# Patient Record
Sex: Male | Born: 1950
Health system: Southern US, Community
[De-identification: ages and names within clinical notes are randomized; demographics above are authoritative.]

## PROBLEM LIST (undated history)

## (undated) DIAGNOSIS — C801 Malignant (primary) neoplasm, unspecified: Secondary | ICD-10-CM

## (undated) DIAGNOSIS — K219 Gastro-esophageal reflux disease without esophagitis: Secondary | ICD-10-CM

## (undated) DIAGNOSIS — T7840XA Allergy, unspecified, initial encounter: Secondary | ICD-10-CM

## (undated) HISTORY — DX: Allergy, unspecified, initial encounter: T78.40XA

## (undated) HISTORY — DX: Gastro-esophageal reflux disease without esophagitis: K21.9

## (undated) HISTORY — DX: Malignant (primary) neoplasm, unspecified: C80.1

## (undated) HISTORY — PX: OTHER SURGICAL HISTORY: SHX169

---

## 2017-09-02 ENCOUNTER — Ambulatory Visit (INDEPENDENT_AMBULATORY_CARE_PROVIDER_SITE_OTHER): Payer: Medicare Other | Admitting: Family Medicine

## 2017-09-02 ENCOUNTER — Encounter: Payer: Self-pay | Admitting: Family Medicine

## 2017-09-02 VITALS — BP 120/70 | HR 68 | Ht 66.6 in | Wt 168.5 lb

## 2017-09-02 DIAGNOSIS — N401 Enlarged prostate with lower urinary tract symptoms: Secondary | ICD-10-CM | POA: Insufficient documentation

## 2017-09-02 DIAGNOSIS — Z0000 Encounter for general adult medical examination without abnormal findings: Secondary | ICD-10-CM | POA: Insufficient documentation

## 2017-09-02 DIAGNOSIS — Z0001 Encounter for general adult medical examination with abnormal findings: Secondary | ICD-10-CM

## 2017-09-02 DIAGNOSIS — E7800 Pure hypercholesterolemia, unspecified: Secondary | ICD-10-CM

## 2017-09-02 DIAGNOSIS — R3912 Poor urinary stream: Secondary | ICD-10-CM

## 2017-09-02 MED ORDER — TAMSULOSIN HCL 0.4 MG PO CAPS: 0.4000 mg | ORAL_CAPSULE | Freq: Every day | ORAL | 3 refills | Status: DC

## 2017-09-02 NOTE — Patient Instructions (Signed)
Health Maintenance, Male A healthy lifestyle and preventive care is important for your health and wellness. Ask your health care provider about what schedule of regular examinations is right for you. What should I know about weight and diet? Eat a Healthy Diet  Eat plenty of vegetables, fruits, whole grains, low-fat dairy products, and lean protein.  Do not eat a lot of foods high in solid fats, added sugars, or salt.  Maintain a Healthy Weight Regular exercise can help you achieve or maintain a healthy weight. You should:  Do at least 150 minutes of exercise each week. The exercise should increase your heart rate and make you sweat (moderate-intensity exercise).  Do strength-training exercises at least twice a week.  Watch Your Levels of Cholesterol and Blood Lipids  Have your blood tested for lipids and cholesterol every 5 years starting at 66 years of age. If you are at high risk for heart disease, you should start having your blood tested when you are 66 years old. You may need to have your cholesterol levels checked more often if: ? Your lipid or cholesterol levels are high. ? You are older than 66 years of age. ? You are at high risk for heart disease.  What should I know about cancer screening? Many types of cancers can be detected early and may often be prevented. Lung Cancer  You should be screened every year for lung cancer if: ? You are a current smoker who has smoked for at least 30 years. ? You are a former smoker who has quit within the past 15 years.  Talk to your health care provider about your screening options, when you should start screening, and how often you should be screened.  Colorectal Cancer  Routine colorectal cancer screening usually begins at 66 years of age and should be repeated every 5-10 years until you are 66 years old. You may need to be screened more often if early forms of precancerous polyps or small growths are found. Your health care provider  may recommend screening at an earlier age if you have risk factors for colon cancer.  Your health care provider may recommend using home test kits to check for hidden blood in the stool.  A small camera at the end of a tube can be used to examine your colon (sigmoidoscopy or colonoscopy). This checks for the earliest forms of colorectal cancer.  Prostate and Testicular Cancer  Depending on your age and overall health, your health care provider may do certain tests to screen for prostate and testicular cancer.  Talk to your health care provider about any symptoms or concerns you have about testicular or prostate cancer.  Skin Cancer  Check your skin from head to toe regularly.  Tell your health care provider about any new moles or changes in moles, especially if: ? There is a change in a mole's size, shape, or color. ? You have a mole that is larger than a pencil eraser.  Always use sunscreen. Apply sunscreen liberally and repeat throughout the day.  Protect yourself by wearing long sleeves, pants, a wide-brimmed hat, and sunglasses when outside.  What should I know about heart disease, diabetes, and high blood pressure?  If you are 18-39 years of age, have your blood pressure checked every 3-5 years. If you are 40 years of age or older, have your blood pressure checked every year. You should have your blood pressure measured twice-once when you are at a hospital or clinic, and once when   you are not at a hospital or clinic. Record the average of the two measurements. To check your blood pressure when you are not at a hospital or clinic, you can use: ? An automated blood pressure machine at a pharmacy. ? A home blood pressure monitor.  Talk to your health care provider about your target blood pressure.  If you are between 11-39 years old, ask your health care provider if you should take aspirin to prevent heart disease.  Have regular diabetes screenings by checking your fasting blood  sugar level. ? If you are at a normal weight and have a low risk for diabetes, have this test once every three years after the age of 74. ? If you are overweight and have a high risk for diabetes, consider being tested at a younger age or more often.  A one-time screening for abdominal aortic aneurysm (AAA) by ultrasound is recommended for men aged 7-75 years who are current or former smokers. What should I know about preventing infection? Hepatitis B If you have a higher risk for hepatitis B, you should be screened for this virus. Talk with your health care provider to find out if you are at risk for hepatitis B infection. Hepatitis C Blood testing is recommended for:  Everyone born from [birth year] through 1964.  Anyone with known risk factors for hepatitis C.  Sexually Transmitted Diseases (STDs)  You should be screened each year for STDs including gonorrhea and chlamydia if: ? You are sexually active and are younger than 66 years of age. ? You are older than 66 years of age and your health care provider tells you that you are at risk for this type of infection. ? Your sexual activity has changed since you were last screened and you are at an increased risk for chlamydia or gonorrhea. Ask your health care provider if you are at risk.  Talk with your health care provider about whether you are at high risk of being infected with HIV. Your health care provider may recommend a prescription medicine to help prevent HIV infection.  What else can I do?  Schedule regular health, dental, and eye exams.  Stay current with your vaccines (immunizations).  Do not use any tobacco products, such as cigarettes, chewing tobacco, and e-cigarettes. If you need help quitting, ask your health care provider.  Limit alcohol intake to no more than 2 drinks per day. One drink equals 12 ounces of beer, 5 ounces of wine, or 1 ounces of hard liquor.  Do not use street drugs.  Do not share needles.  Ask your  health care provider for help if you need support or information about quitting drugs.  Tell your health care provider if you often feel depressed.  Tell your health care provider if you have ever been abused or do not feel safe at home. This information is not intended to replace advice given to you by your health care provider. Make sure you discuss any questions you have with your health care provider. Document Released: 07/12/2007 Document Revised: 09/12/2015 Document Reviewed: 10/17/2014 Elsevier Interactive Patient Education  2018 [employer]  Benign Prostatic Hyperplasia Benign prostatic hyperplasia (BPH) is an enlarged prostate gland that is caused by the normal aging process and not by cancer. The prostate is a walnut-sized gland that is involved in the production of semen. It is located in front of the rectum and below the bladder. The bladder stores urine and the urethra is the tube that carries the urine  out of the body. The prostate may get bigger as a man gets older. An enlarged prostate can press on the urethra. This can make it harder to pass urine. The build-up of urine in the bladder can cause infection. Back pressure and infection may progress to bladder damage and kidney (renal) failure. What are the causes? This condition is part of a normal aging process. However, not all men develop problems from this condition. If the prostate enlarges away from the urethra, urine flow will not be blocked. If it enlarges toward the urethra and compresses it, there will be problems passing urine. What increases the risk? This condition is more likely to develop in men over the age of 80 years. What are the signs or symptoms? Symptoms of this condition include:  Getting up often during the night to urinate.  Needing to urinate frequently during the day.  Difficulty starting urine flow.  Decrease in size and strength of your urine stream.  Leaking (dribbling) after  urinating.  Inability to pass urine. This needs immediate treatment.  Inability to completely empty your bladder.  Pain when you pass urine. This is more common if there is also an infection.  Urinary tract infection (UTI).  How is this diagnosed? This condition is diagnosed based on your medical history, a physical exam, and your symptoms. Tests will also be done, such as:  A post-void bladder scan. This measures any amount of urine that may remain in your bladder after you finish urinating.  A digital rectal exam. In a rectal exam, your health care provider checks your prostate by putting a lubricated, gloved finger into your rectum to feel the back of your prostate gland. This exam detects the size of your gland and any abnormal lumps or growths.  An exam of your urine (urinalysis).  A prostate specific antigen (PSA) screening. This is a blood test used to screen for prostate cancer.  An ultrasound. This test uses sound waves to electronically produce a picture of your prostate gland.  Your health care provider may refer you to a specialist in kidney and prostate diseases (urologist). How is this treated? Once symptoms begin, your health care provider will monitor your condition (active surveillance or watchful waiting). Treatment for this condition will depend on the severity of your condition. Treatment may include:  Observation and yearly exams. This may be the only treatment needed if your condition and symptoms are mild.  Medicines to relieve your symptoms, including: ? Medicines to shrink the prostate. ? Medicines to relax the muscle of the prostate.  Surgery in severe cases. Surgery may include: ? Prostatectomy. In this procedure, the prostate tissue is removed completely through an open incision or with a laparascope or robotics. ? Transurethral resection of the prostate (TURP). In this procedure, a tool is inserted through the opening at the tip of the penis (urethra).  It is used to cut away tissue of the inner core of the prostate. The pieces are removed through the same opening of the penis. This removes the blockage. ? Transurethral incision (TUIP). In this procedure, small cuts are made in the prostate. This lessens the prostate's pressure on the urethra. ? Transurethral microwave thermotherapy (TUMT). This procedure uses microwaves to create heat. The heat destroys and removes a small amount of prostate tissue. ? Transurethral needle ablation (TUNA). This procedure uses radio frequencies to destroy and remove a small amount of prostate tissue. ? Interstitial laser coagulation ([place]). This procedure uses a laser to destroy and  remove a small amount of prostate tissue. ? Transurethral electrovaporization (TUVP). This procedure uses electrodes to destroy and remove a small amount of prostate tissue. ? Prostatic urethral lift. This procedure inserts an implant to push the lobes of the prostate away from the urethra.  Follow these instructions at home:  Take over-the-counter and prescription medicines only as told by your health care provider.  Monitor your symptoms for any changes. Contact your health care provider with any changes.  Avoid drinking large amounts of liquid before going to bed or out in public.  Avoid or reduce how much caffeine or alcohol you drink.  Give yourself time when you urinate.  Keep all follow-up visits as told by your health care provider. This is important. Contact a health care provider if:  You have unexplained back pain.  Your symptoms do not get better with treatment.  You develop side effects from the medicine you are taking.  Your urine becomes very dark or has a bad smell.  Your lower abdomen becomes distended and you have trouble passing your urine. Get help right away if:  You have a fever or chills.  You suddenly cannot urinate.  You feel lightheaded, or very dizzy, or you faint.  There are large amounts  of blood or clots in the urine.  Your urinary problems become hard to manage.  You develop moderate to severe low back or flank pain. The flank is the side of your body between the ribs and the hip. These symptoms may represent a serious problem that is an emergency. Do not wait to see if the symptoms will go away. Get medical help right away. Call your local emergency services (911 in the [place] Do not drive yourself to the hospital. Summary  Benign prostatic hyperplasia (BPH) is an enlarged prostate that is caused by the normal aging process and not by cancer.  An enlarged prostate can press on the urethra. This can make it hard to pass urine.  This condition is part of a normal aging process and is more likely to develop in men over the age of 55 years.  Get help right away if you suddenly cannot urinate. This information is not intended to replace advice given to you by your health care provider. Make sure you discuss any questions you have with your health care provider. Document Released: 01/13/2005 Document Revised: 02/18/2016 Document Reviewed: 02/18/2016 Elsevier Interactive Patient Education  [employer].

## 2017-09-02 NOTE — Progress Notes (Addendum)
Subjective:  Patient ID: Robert Meyers, male    DOB: 11-04-1950  Age: 67 y.o. MRN: [id number]  CC: Establish Care   HPI Glyndon Tursi presents for a physical exam and establishment of care.  He enjoys excellent health.  He lives with his wife for 47 years.  They have 2 grown children and several grandchildren.  He works as an Engineer, structural in [place].  He does not smoke or use illicit drugs.  He rarely drinks alcohol.  He has no regular exercise program but he is quite active at work.  Last colonoscopy was 3 years ago.  He is status post excision of malignant melanoma on his back.  He does have issues with bilateral foot pain at the end of the day.  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  He does have some issues with decreased urine flow and occasional nocturia.  There is no blood in his urine.  He is not interested in surgery but might be interested in trying medical therapy for this.  History Juanita has a past medical history of Allergy, Cancer ([place]), and GERD (gastroesophageal reflux disease).   He has a past surgical history that includes deep axillary lymphnode biopsy; melanoma wide excision surgery right upper back (stage 2); excision of spermatocele with epididyectomy; bilateral inguinal hernia repair; and excision left radial head.   His family history includes Cancer in his father; Hyperlipidemia in his mother.He reports that he has never smoked. He has never used smokeless tobacco. His alcohol and drug histories are not on file.  Outpatient Medications Prior to Visit  Medication Sig Dispense Refill  . aspirin EC 81 MG tablet Take 81 mg by mouth daily.    Marland Kitchen omeprazole (PRILOSEC) 20 MG capsule Take 20 mg by mouth daily.     No facility-administered medications prior to visit.     ROS Review of Systems  Constitutional: Negative for chills, fatigue, fever and unexpected weight change.  HENT: Negative.   Eyes: Negative for photophobia and  visual disturbance.  Respiratory: Negative.   Cardiovascular: Negative.   Gastrointestinal: Negative.   Endocrine: Negative for polyphagia and polyuria.  Genitourinary: Positive for difficulty urinating. Negative for decreased urine volume, dysuria and frequency.  Musculoskeletal: Positive for arthralgias. Negative for myalgias.  Skin: Negative for color change and pallor.  Allergic/Immunologic: Negative for immunocompromised state.  Neurological: Negative for seizures, speech difficulty and light-headedness.  Hematological: Does not bruise/bleed easily.  Psychiatric/Behavioral: Negative.     Objective:  BP 120/70   Pulse 68   Ht 5' 6.6" (1.692 m)   Wt 168 lb 8 oz (76.4 kg)   SpO2 98%   BMI 26.71 kg/m   Physical Exam  Constitutional: He is oriented to person, place, and time. He appears well-developed and well-nourished. No distress.  HENT:  Head: Normocephalic and atraumatic.  Right Ear: External ear normal.  Left Ear: External ear normal.  Mouth/Throat: Oropharynx is clear and moist. No oropharyngeal exudate.  Eyes: Pupils are equal, round, and reactive to light. Conjunctivae and EOM are normal. Right eye exhibits no discharge. Left eye exhibits no discharge. No scleral icterus.  Neck: Normal range of motion. Neck supple. No JVD present. No tracheal deviation present. No thyromegaly present.  Cardiovascular: Normal rate, regular rhythm and normal heart sounds.  Pulses:      Dorsalis pedis pulses are 2+ on the right side, and 2+ on the left side.       Posterior tibial  pulses are 2+ on the right side, and 2+ on the left side.  Pulmonary/Chest: Effort normal and breath sounds normal.  Abdominal: Soft. Bowel sounds are normal. He exhibits no distension and no mass. There is no tenderness. There is no rebound and no guarding.  Genitourinary: Rectal exam shows no external hemorrhoid, no internal hemorrhoid, no fissure, no mass, no tenderness, anal tone normal and guaiac negative  stool. Prostate is not enlarged and not tender.  Musculoskeletal: He exhibits no edema or tenderness.       Right foot: There is deformity (planus).       Left foot: There is deformity (planus).  Lymphadenopathy:    He has no cervical adenopathy.  Neurological: He is alert and oriented to person, place, and time.  Skin: Skin is warm and dry. Capillary refill takes less than 2 seconds. No rash noted. He is not diaphoretic. No erythema. No pallor.  Psychiatric: He has a normal mood and affect. His behavior is normal. Thought content normal.    The 10-year ASCVD risk score Mikey Bussing DC Jr., et al., 2013) is: 13.2%   Values used to calculate the score:     Age: 49 years     Sex: Male     Is Non-Hispanic African American: No     Diabetic: No     Tobacco smoker: No     Systolic Blood Pressure: 973 mmHg     Is BP treated: No     HDL Cholesterol: 43.3 mg/dL     Total Cholesterol: 219 mg/dL  Assessment & Plan:   Zade was seen today for establish care.  Diagnoses and all orders for this visit:  Healthcare maintenance -     CBC; Future -     Comprehensive metabolic panel; Future -     Lipid panel; Future -     Urinalysis, Routine w reflex microscopic; Future -     HIV antibody; Future  Benign prostatic hyperplasia with weak urinary stream -     tamsulosin (FLOMAX) 0.4 MG CAPS capsule; Take 1 capsule (0.4 mg total) by mouth daily. -     PSA; Future -     Urinalysis, Routine w reflex microscopic; Future  Elevated LDL cholesterol level -     atorvastatin (LIPITOR) 20 MG tablet; Take 1 tablet (20 mg total) by mouth daily.   I am having Massie Bougie start on tamsulosin and atorvastatin. I am also having him maintain his aspirin EC and omeprazole.  Meds ordered this encounter  Medications  . tamsulosin (FLOMAX) 0.4 MG CAPS capsule    Sig: Take 1 capsule (0.4 mg total) by mouth daily.    Dispense:  30 capsule    Refill:  3  . atorvastatin (LIPITOR) 20 MG tablet    Sig: Take 1  tablet (20 mg total) by mouth daily.    Dispense:  90 tablet    Refill:  1   Patient will return fasting for his blood work.  Overall his health is quite good.  Advised him to change his shoes on a regular basis.  He will consider referral to sports medicine for orthotics.  He will try Flomax and RTC in 3 months for follow-up.  Anticipatory guidance was given regarding health maintenance and prevention of disease.  Addendum: Pt with sig risk of ascvd in 10 years. Sp 10 minute discussion with him he agree for a trial of atorvastatin.   Follow-up: Return in about 3 months (around 12/03/2017).  [name],  MD

## 2017-09-03 ENCOUNTER — Other Ambulatory Visit (INDEPENDENT_AMBULATORY_CARE_PROVIDER_SITE_OTHER): Payer: Medicare Other

## 2017-09-03 ENCOUNTER — Encounter: Payer: Self-pay | Admitting: Family Medicine

## 2017-09-03 DIAGNOSIS — Z0000 Encounter for general adult medical examination without abnormal findings: Secondary | ICD-10-CM

## 2017-09-03 DIAGNOSIS — N401 Enlarged prostate with lower urinary tract symptoms: Secondary | ICD-10-CM

## 2017-09-03 DIAGNOSIS — R3912 Poor urinary stream: Secondary | ICD-10-CM | POA: Diagnosis not present

## 2017-09-03 LAB — URINALYSIS, ROUTINE W REFLEX MICROSCOPIC
BILIRUBIN URINE: NEGATIVE
Hgb urine dipstick: NEGATIVE
KETONES UR: NEGATIVE
LEUKOCYTES UA: NEGATIVE
Nitrite: NEGATIVE
PH: 7 (ref 5.0–8.0)
RBC / HPF: NONE SEEN (ref 0–?)
Specific Gravity, Urine: 1.015 (ref 1.000–1.030)
Total Protein, Urine: NEGATIVE
UROBILINOGEN UA: 0.2 (ref 0.0–1.0)
Urine Glucose: NEGATIVE

## 2017-09-03 LAB — COMPREHENSIVE METABOLIC PANEL
ALK PHOS: 43 U/L (ref 39–117)
ALT: 27 U/L (ref 0–53)
AST: 21 U/L (ref 0–37)
Albumin: 4.5 g/dL (ref 3.5–5.2)
BILIRUBIN TOTAL: 0.7 mg/dL (ref 0.2–1.2)
BUN: 21 mg/dL (ref 6–23)
CO2: 32 mEq/L (ref 19–32)
CREATININE: 1.05 mg/dL (ref 0.40–1.50)
Calcium: 9.4 mg/dL (ref 8.4–10.5)
Chloride: 102 mEq/L (ref 96–112)
GFR: 75.16 mL/min (ref >60.00)
GLUCOSE: 100 mg/dL — AB (ref 70–99)
Potassium: 4.1 mEq/L (ref 3.5–5.1)
SODIUM: 140 meq/L (ref 135–145)
TOTAL PROTEIN: 6.5 g/dL (ref 6.0–8.3)

## 2017-09-03 LAB — LIPID PANEL
CHOLESTEROL: 219 mg/dL — AB (ref 0–200)
HDL: 43.3 mg/dL (ref >39.00)
LDL Cholesterol: 153 mg/dL — ABNORMAL HIGH (ref 0–99)
NONHDL: 175.9
Total CHOL/HDL Ratio: 5
Triglycerides: 114 mg/dL (ref 0.0–149.0)
VLDL: 22.8 mg/dL (ref 0.0–40.0)

## 2017-09-03 LAB — CBC
HCT: 44.2 % (ref 39.0–52.0)
HEMOGLOBIN: 15.1 g/dL (ref 13.0–17.0)
MCHC: 34.1 g/dL (ref 30.0–36.0)
MCV: 92.4 fl (ref 78.0–100.0)
Platelets: 218 10*3/uL (ref 150.0–400.0)
RBC: 4.78 Mil/uL (ref 4.22–5.81)
RDW: 12.5 % (ref 11.5–15.5)
WBC: 6.8 10*3/uL (ref 4.0–10.5)

## 2017-09-03 LAB — PSA: PSA: 1.26 ng/mL (ref 0.10–4.00)

## 2017-09-04 LAB — HIV ANTIBODY (ROUTINE TESTING W REFLEX): HIV 1&2 Ab, 4th Generation: NONREACTIVE

## 2017-09-07 ENCOUNTER — Telehealth: Payer: Self-pay

## 2017-09-07 NOTE — Telephone Encounter (Signed)
Copied from [place] [id number]. Topic: General - Call Back - No Documentation >> Sep 07, 2017  2:04 PM Conception Chancy, NT wrote: Reason for CRM: patient is calling to return Dr. Ethelene Hal phone call he states he missed Friday 09/04/17.

## 2017-09-08 MED ORDER — ATORVASTATIN CALCIUM 20 MG PO TABS
20.0000 mg | ORAL_TABLET | Freq: Every day | ORAL | 1 refills | Status: DC
Start: 2017-09-08 — End: 2018-10-18

## 2017-09-08 NOTE — Addendum Note (Signed)
Addended by: Jon Billings on: 09/08/2017 08:56 AM   Modules accepted: Orders

## 2017-09-09 NOTE — Telephone Encounter (Signed)
Provider spoke with patient, see result note.

## 2018-10-18 ENCOUNTER — Ambulatory Visit (INDEPENDENT_AMBULATORY_CARE_PROVIDER_SITE_OTHER): Payer: Medicare Other | Admitting: Family Medicine

## 2018-10-18 ENCOUNTER — Other Ambulatory Visit: Payer: Self-pay

## 2018-10-18 ENCOUNTER — Telehealth: Payer: Self-pay

## 2018-10-18 ENCOUNTER — Encounter: Payer: Self-pay | Admitting: Family Medicine

## 2018-10-18 DIAGNOSIS — J0191 Acute recurrent sinusitis, unspecified: Secondary | ICD-10-CM | POA: Insufficient documentation

## 2018-10-18 DIAGNOSIS — J0100 Acute maxillary sinusitis, unspecified: Secondary | ICD-10-CM

## 2018-10-18 DIAGNOSIS — E7800 Pure hypercholesterolemia, unspecified: Secondary | ICD-10-CM | POA: Insufficient documentation

## 2018-10-18 MED ORDER — ATORVASTATIN CALCIUM 20 MG PO TABS: 20.0000 mg | ORAL_TABLET | Freq: Every day | ORAL | 1 refills | Status: DC

## 2018-10-18 MED ORDER — PREDNISONE 10 MG PO TABS: 10.0000 mg | ORAL_TABLET | Freq: Two times a day (BID) | ORAL | 0 refills | Status: AC

## 2018-10-18 MED ORDER — AMOXICILLIN 500 MG PO CAPS
500.0000 mg | ORAL_CAPSULE | Freq: Three times a day (TID) | ORAL | 0 refills | Status: DC
Start: 2018-10-18 — End: 2018-12-10

## 2018-10-18 NOTE — Telephone Encounter (Signed)
Copied from [place] [id number]. Topic: General - Inquiry >> Oct 18, 2018 11:28 AM Richardo Priest, NT wrote: Reason for CRM: Patient called in stating he is having a sinus infection for 2 months.Patient is just wanting an antibiotic to be called in. Offered appointment but stated he does not want one. Please advise. Call back is [phone].

## 2018-10-18 NOTE — Telephone Encounter (Signed)
I called and spoke to patient. Patient schedule for a virtual visit today 10/18/2018 with Dr. Ethelene Hal.

## 2018-10-18 NOTE — Telephone Encounter (Signed)
Can we offer him a virtual visit? He hasn't been seen in over a year.

## 2018-10-18 NOTE — Progress Notes (Signed)
Established Patient Office Visit  Subjective:  Patient ID: Robert Meyers, male    DOB: [birth year]  Age: 68 y.o. MRN: [id number]  CC:  Chief Complaint  Patient presents with  . Sinusitis    x 2 months    HPI Robert Meyers presents for treatment and evaluation of a 2 to 102-month history of ongoing nasal congestion postnasal drip sneezing.  Is become worse over the last 2 to 3 weeks with facial pressure in his left cheekbone associated with upper tooth discomfort on the left side.  He has had no fevers or chills cough or difficulty breathing.  No change in his taste smell.  He never did start the tamsulosin.  He does not feel as though his urine flow is compromised to the point to where he needs medication at this time.  He did buy the atorvastatin but never started it.  Past Medical History:  Diagnosis Date  . Allergy   . Cancer ([place])   . GERD (gastroesophageal reflux disease)     Past Surgical History:  Procedure Laterality Date  . bilateral inguinal hernia repair    . deep axillary lymphnode biopsy    . excision left radial head    . excision of spermatocele with epididyectomy    . melanoma wide excision surgery right upper back (stage 2)      Family History  Problem Relation Age of Onset  . Hyperlipidemia Mother   . Cancer Father     Social History   Socioeconomic History  . Marital status: Married    Spouse name: Not on file  . Number of children: Not on file  . Years of education: Not on file  . Highest education level: Not on file  Occupational History  . Not on file  Social Needs  . Financial resource strain: Not on file  . Food insecurity    Worry: Not on file    Inability: Not on file  . Transportation needs    Medical: Not on file    Non-medical: Not on file  Tobacco Use  . Smoking status: Never Smoker  . Smokeless tobacco: Never Used  Substance and Sexual Activity  . Alcohol use: Not on file  . Drug use: Not on file  . Sexual activity: Not on  file  Lifestyle  . Physical activity    Days per week: Not on file    Minutes per session: Not on file  . Stress: Not on file  Relationships  . Social Herbalist on phone: Not on file    Gets together: Not on file    Attends religious service: Not on file    Active member of club or organization: Not on file    Attends meetings of clubs or organizations: Not on file    Relationship status: Not on file  . Intimate partner violence    Fear of current or ex partner: Not on file    Emotionally abused: Not on file    Physically abused: Not on file    Forced sexual activity: Not on file  Other Topics Concern  . Not on file  Social History Narrative  . Not on file    Outpatient Medications Prior to Visit  Medication Sig Dispense Refill  . aspirin EC 81 MG tablet Take 81 mg by mouth daily.    Marland Kitchen omeprazole (PRILOSEC) 20 MG capsule Take 20 mg by mouth daily.    . tamsulosin (FLOMAX) 0.4 MG CAPS capsule  Take 1 capsule (0.4 mg total) by mouth daily. 30 capsule 3  . atorvastatin (LIPITOR) 20 MG tablet Take 1 tablet (20 mg total) by mouth daily. 90 tablet 1   No facility-administered medications prior to visit.     Allergies  Allergen Reactions  . Levofloxacin Hives and Rash    Hives Hives Hives     ROS Review of Systems  Constitutional: Negative for chills, diaphoresis, fatigue, fever and unexpected weight change.  HENT: Positive for congestion, dental problem, postnasal drip, sinus pressure and sinus pain.   Eyes: Negative for photophobia and visual disturbance.  Respiratory: Negative for cough and shortness of breath.   Cardiovascular: Negative.   Gastrointestinal: Negative.   Endocrine: Negative for polyphagia and polyuria.  Musculoskeletal: Negative for arthralgias and joint swelling.  Skin: Negative for pallor and rash.  Neurological: Negative for headaches.  Hematological: Negative.   Psychiatric/Behavioral: Negative.       Objective:    Physical Exam   Constitutional: He is oriented to person, place, and time. He appears well-developed and well-nourished. No distress.  HENT:  Head: Normocephalic and atraumatic.  Right Ear: External ear normal.  Left Ear: External ear normal.  Eyes: Conjunctivae are normal. Right eye exhibits no discharge. Left eye exhibits no discharge. No scleral icterus.  Neck: No JVD present. No tracheal deviation present.  Pulmonary/Chest: Effort normal. No stridor.  Musculoskeletal: Normal range of motion.  Neurological: He is alert and oriented to person, place, and time.  Skin: Skin is warm and dry. He is not diaphoretic.  Psychiatric: He has a normal mood and affect. His behavior is normal.    There were no vitals taken for this visit. Wt Readings from Last 3 Encounters:  09/02/17 168 lb 8 oz (76.4 kg)   BP Readings from Last 3 Encounters:  09/02/17 120/70   Guideline developer:  UpToDate (see UpToDate for funding source) Date Released: June 2014  Health Maintenance Due  Topic Date Due  . Hepatitis C Screening  August 17, 1950  . TETANUS/TDAP  11/23/1970  . COLONOSCOPY  11/22/2001  . PNA vac Low Risk Adult (1 of 2 - PCV13) 11/22/2016  . INFLUENZA VACCINE  08/28/2018    There are no preventive care reminders to display for this patient.  No results found for: TSH Lab Results  Component Value Date   WBC 6.8 09/03/2017   HGB 15.1 09/03/2017   HCT 44.2 09/03/2017   MCV 92.4 09/03/2017   PLT 218.0 09/03/2017   Lab Results  Component Value Date   NA 140 09/03/2017   K 4.1 09/03/2017   CO2 32 09/03/2017   GLUCOSE 100 (H) 09/03/2017   BUN 21 09/03/2017   CREATININE 1.05 09/03/2017   BILITOT 0.7 09/03/2017   ALKPHOS 43 09/03/2017   AST 21 09/03/2017   ALT 27 09/03/2017   PROT 6.5 09/03/2017   ALBUMIN 4.5 09/03/2017   CALCIUM 9.4 09/03/2017   GFR 75.16 09/03/2017   Lab Results  Component Value Date   CHOL 219 (H) 09/03/2017   Lab Results  Component Value Date   HDL 43.30 09/03/2017    Lab Results  Component Value Date   LDLCALC 153 (H) 09/03/2017   Lab Results  Component Value Date   TRIG 114.0 09/03/2017   Lab Results  Component Value Date   CHOLHDL 5 09/03/2017   No results found for: HGBA1C    Assessment & Plan:   Problem List Items Addressed This Visit      Respiratory  Acute non-recurrent maxillary sinusitis - Primary   Relevant Medications   amoxicillin (AMOXIL) 500 MG capsule   predniSONE (DELTASONE) 10 MG tablet     Other   Elevated LDL cholesterol level   Relevant Medications   atorvastatin (LIPITOR) 20 MG tablet      Meds ordered this encounter  Medications  . amoxicillin (AMOXIL) 500 MG capsule    Sig: Take 1 capsule (500 mg total) by mouth 3 (three) times daily.    Dispense:  30 capsule    Refill:  0  . predniSONE (DELTASONE) 10 MG tablet    Sig: Take 1 tablet (10 mg total) by mouth 2 (two) times daily with a meal for 5 days.    Dispense:  10 tablet    Refill:  0  . atorvastatin (LIPITOR) 20 MG tablet    Sig: Take 1 tablet (20 mg total) by mouth daily.    Dispense:  90 tablet    Refill:  1    Follow-up: No follow-ups on file.   Patient will continue his nasal saline and Flonase.  Start amoxicillin and a short course of prednisone.  Follow-up if not improving in a week or 2.  Patient agrees to start the atorvastatin and follow-up prior to finishing it.  He will hold off with the tamsulosin at this point. Virtual Visit via Video Note  I connected with Robert Meyers on 10/18/18 at  1:30 PM EDT by a video enabled telemedicine application and verified that I am speaking with the correct person using two identifiers.  Location: Patient: home Provider:    I discussed the limitations of evaluation and management by telemedicine and the availability of in person appointments. The patient expressed understanding and agreed to proceed.  History of Present Illness:    Observations/Objective:   Assessment and Plan:   Follow Up  Instructions:    I discussed the assessment and treatment plan with the patient. The patient was provided an opportunity to ask questions and all were answered. The patient agreed with the plan and demonstrated an understanding of the instructions.   The patient was advised to call back or seek an in-person evaluation if the symptoms worsen or if the condition fails to improve as anticipated.  I provided 20 minutes of non-face-to-face time during this encounter.   [name], MD

## 2018-12-10 ENCOUNTER — Encounter: Payer: Self-pay | Admitting: Family Medicine

## 2018-12-10 ENCOUNTER — Ambulatory Visit (INDEPENDENT_AMBULATORY_CARE_PROVIDER_SITE_OTHER): Payer: Medicare Other | Admitting: Family Medicine

## 2018-12-10 ENCOUNTER — Other Ambulatory Visit: Payer: Self-pay

## 2018-12-10 DIAGNOSIS — J0191 Acute recurrent sinusitis, unspecified: Secondary | ICD-10-CM

## 2018-12-10 MED ORDER — DOXYCYCLINE HYCLATE 100 MG PO TABS: 100.0000 mg | ORAL_TABLET | Freq: Two times a day (BID) | ORAL | 0 refills | Status: DC

## 2018-12-10 NOTE — Assessment & Plan Note (Addendum)
-  Treat with course of doxycyline.  Allergies may be contributing as well.  Recommend OTC antihistamine such as allegra or cetirizine.  Recommend consistent use of flonase.  -If not improving with this we discussed possible referral to ENT.   -He will let us know if he has any new or worsening symptoms related to this.

## 2018-12-10 NOTE — Progress Notes (Signed)
Robert Meyers - 68 y.o. male MRN [id number]  Date of birth: 1951/01/22   This visit type was conducted due to national recommendations for restrictions regarding the COVID-19 Pandemic (e.g. social distancing).  This format is felt to be most appropriate for this patient at this time.  All issues noted in this document were discussed and addressed.  No physical exam was performed (except for noted visual exam findings with Video Visits).  I discussed the limitations of evaluation and management by telemedicine and the availability of in person appointments. The patient expressed understanding and agreed to proceed.  I connected with@ on 12/10/18 at 11:00 AM EST by a video enabled telemedicine application and verified that I am speaking with the correct person using two identifiers.  Present for visit: Robert Meyers   Patient Location: Home [place] [address]   Provider location:   [place]  Chief Complaint  Patient presents with  . Sinusitis    pt said he has headaches and congestion worse at night and he rinse his nose in the mornings mucus always comes out. Started in sept and antibiotics didnt work.    HPI  Robert Meyers is a 68 y.o. male who presents via audio/video conferencing for a telehealth visit today.  He has complaint of possible sinus infection.  He reports history of congestion and sinus headaches for several months with worsening here recently.  He is using sinus rinse daily which will help for a short period before he develops increased sinus pain and headaches again.  He was treated with amoxicillin and prednisone in September but doesn't really feel like things improved with this.  He does report a history of allergies.  He was prescribed flonase previously but never took this consistently. He has not tried an antihistamine.  He denies fever, chills, nausea, vomiting, diarrhea, body aches or cough with current symptoms.      ROS:  A comprehensive ROS was completed and negative except as noted per HPI  Past Medical History:  Diagnosis Date  . Allergy   . Cancer ([place])   . GERD (gastroesophageal reflux disease)     Past Surgical History:  Procedure Laterality Date  . bilateral inguinal hernia repair    . deep axillary lymphnode biopsy    . excision left radial head    . excision of spermatocele with epididyectomy    . melanoma wide excision surgery right upper back (stage 2)      Family History  Problem Relation Age of Onset  . Hyperlipidemia Mother   . Cancer Father     Social History   Socioeconomic History  . Marital status: Married    Spouse name: Not on file  . Number of children: Not on file  . Years of education: Not on file  . Highest education level: Not on file  Occupational History  . Not on file  Social Needs  . Financial resource strain: Not on file  . Food insecurity    Worry: Not on file    Inability: Not on file  . Transportation needs    Medical: Not on file    Non-medical: Not on file  Tobacco Use  . Smoking status: Never Smoker  . Smokeless tobacco: Never Used  Substance and Sexual Activity  . Alcohol use: Not on file  . Drug use: Not on file  . Sexual activity: Not on file  Lifestyle  . Physical activity    Days per week:  Not on file    Minutes per session: Not on file  . Stress: Not on file  Relationships  . Social Herbalist on phone: Not on file    Gets together: Not on file    Attends religious service: Not on file    Active member of club or organization: Not on file    Attends meetings of clubs or organizations: Not on file    Relationship status: Not on file  . Intimate partner violence    Fear of current or ex partner: Not on file    Emotionally abused: Not on file    Physically abused: Not on file    Forced sexual activity: Not on file  Other Topics Concern  . Not on file  Social History Narrative  . Not on file     Current  Outpatient Medications:  .  aspirin EC 81 MG tablet, Take 81 mg by mouth daily., Disp: , Rfl:  .  atorvastatin (LIPITOR) 20 MG tablet, Take 1 tablet (20 mg total) by mouth daily., Disp: 90 tablet, Rfl: 1 .  omeprazole (PRILOSEC) 20 MG capsule, Take 20 mg by mouth daily., Disp: , Rfl:  .  doxycycline (VIBRA-TABS) 100 MG tablet, Take 1 tablet (100 mg total) by mouth 2 (two) times daily., Disp: 20 tablet, Rfl: 0 .  tamsulosin (FLOMAX) 0.4 MG CAPS capsule, Take 1 capsule (0.4 mg total) by mouth daily. (Patient not taking: Reported on 12/10/2018), Disp: 30 capsule, Rfl: 3  EXAM:  VITALS per patient if applicable: Temp 98 F (123XX123 C) (Oral) Comment: per pt Comment (Src): per pt  Ht 5' 6.6" (1.692 m)   BMI 26.71 kg/m   GENERAL: alert, oriented, appears well and in no acute distress  HEENT: atraumatic, conjunttiva clear, no obvious abnormalities on inspection of external nose and ears  NECK: normal movements of the head and neck  LUNGS: on inspection no signs of respiratory distress, breathing rate appears normal, no obvious gross SOB, gasping or wheezing  CV: no obvious cyanosis  MS: moves all visible extremities without noticeable abnormality  PSYCH/NEURO: pleasant and cooperative, no obvious depression or anxiety, speech and thought processing grossly intact  ASSESSMENT AND PLAN:  Discussed the following assessment and plan:  Acute recurrent sinusitis -Treat with course of doxycyline.  Allergies may be contributing as well.  Recommend OTC antihistamine such as allegra or cetirizine.  Recommend consistent use of flonase.  -If not improving with this we discussed possible referral to ENT.   -He will let us know if he has any new or worsening symptoms related to this.         I discussed the assessment and treatment plan with the patient. The patient was provided an opportunity to ask questions and all were answered. The patient agreed with the plan and demonstrated an understanding  of the instructions.   The patient was advised to call back or seek an in-person evaluation if the symptoms worsen or if the condition fails to improve as anticipated.     Luetta Nutting, DO

## 2018-12-16 DIAGNOSIS — Z85820 Personal history of malignant melanoma of skin: Secondary | ICD-10-CM | POA: Diagnosis not present

## 2018-12-16 DIAGNOSIS — Z85828 Personal history of other malignant neoplasm of skin: Secondary | ICD-10-CM | POA: Diagnosis not present

## 2018-12-16 DIAGNOSIS — L578 Other skin changes due to chronic exposure to nonionizing radiation: Secondary | ICD-10-CM | POA: Diagnosis not present

## 2018-12-16 DIAGNOSIS — L814 Other melanin hyperpigmentation: Secondary | ICD-10-CM | POA: Diagnosis not present

## 2019-02-02 DIAGNOSIS — L820 Inflamed seborrheic keratosis: Secondary | ICD-10-CM | POA: Diagnosis not present

## 2019-02-02 DIAGNOSIS — D485 Neoplasm of uncertain behavior of skin: Secondary | ICD-10-CM | POA: Diagnosis not present

## 2019-04-20 ENCOUNTER — Other Ambulatory Visit: Payer: Self-pay | Admitting: Family Medicine

## 2019-04-20 DIAGNOSIS — E7800 Pure hypercholesterolemia, unspecified: Secondary | ICD-10-CM

## 2019-04-20 NOTE — Telephone Encounter (Signed)
Called patient to schedule follow up appointment. Patient states that he have not been taking medication as he should so he will start back on it daily and give [place] a call in a month to schedule an appointment.

## 2019-05-19 ENCOUNTER — Other Ambulatory Visit: Payer: Self-pay | Admitting: Family Medicine

## 2019-05-19 DIAGNOSIS — E7800 Pure hypercholesterolemia, unspecified: Secondary | ICD-10-CM

## 2019-06-14 ENCOUNTER — Other Ambulatory Visit: Payer: Self-pay | Admitting: Family Medicine

## 2019-06-14 DIAGNOSIS — E7800 Pure hypercholesterolemia, unspecified: Secondary | ICD-10-CM

## 2019-07-11 ENCOUNTER — Other Ambulatory Visit: Payer: Self-pay | Admitting: Family Medicine

## 2019-07-11 DIAGNOSIS — E7800 Pure hypercholesterolemia, unspecified: Secondary | ICD-10-CM

## 2019-07-12 ENCOUNTER — Telehealth: Payer: Self-pay | Admitting: Family Medicine

## 2019-07-12 NOTE — Telephone Encounter (Signed)
error 

## 2019-07-26 ENCOUNTER — Other Ambulatory Visit: Payer: Self-pay

## 2019-07-27 ENCOUNTER — Ambulatory Visit (INDEPENDENT_AMBULATORY_CARE_PROVIDER_SITE_OTHER): Payer: Medicare Other | Admitting: Family Medicine

## 2019-07-27 ENCOUNTER — Encounter: Payer: Self-pay | Admitting: Family Medicine

## 2019-07-27 VITALS — BP 126/70 | HR 66 | Temp 97.6°F | Ht 66.0 in | Wt 172.0 lb

## 2019-07-27 DIAGNOSIS — N401 Enlarged prostate with lower urinary tract symptoms: Secondary | ICD-10-CM

## 2019-07-27 DIAGNOSIS — E7800 Pure hypercholesterolemia, unspecified: Secondary | ICD-10-CM

## 2019-07-27 DIAGNOSIS — R7309 Other abnormal glucose: Secondary | ICD-10-CM | POA: Diagnosis not present

## 2019-07-27 DIAGNOSIS — R6882 Decreased libido: Secondary | ICD-10-CM | POA: Insufficient documentation

## 2019-07-27 DIAGNOSIS — R3912 Poor urinary stream: Secondary | ICD-10-CM

## 2019-07-27 DIAGNOSIS — R5383 Other fatigue: Secondary | ICD-10-CM

## 2019-07-27 DIAGNOSIS — Z0000 Encounter for general adult medical examination without abnormal findings: Secondary | ICD-10-CM

## 2019-07-27 DIAGNOSIS — M25511 Pain in right shoulder: Secondary | ICD-10-CM

## 2019-07-27 LAB — VITAMIN D 25 HYDROXY (VIT D DEFICIENCY, FRACTURES): VITD: 43.07 ng/mL (ref 30.00–100.00)

## 2019-07-27 LAB — COMPREHENSIVE METABOLIC PANEL
ALT: 30 U/L (ref 0–53)
AST: 24 U/L (ref 0–37)
Albumin: 4.8 g/dL (ref 3.5–5.2)
Alkaline Phosphatase: 48 U/L (ref 39–117)
BUN: 19 mg/dL (ref 6–23)
CO2: 29 mEq/L (ref 19–32)
Calcium: 9.4 mg/dL (ref 8.4–10.5)
Chloride: 102 mEq/L (ref 96–112)
Creatinine, Ser: 1.01 mg/dL (ref 0.40–1.50)
GFR: 73.53 mL/min (ref >60.00)
Glucose, Bld: 92 mg/dL (ref 70–99)
Potassium: 3.9 mEq/L (ref 3.5–5.1)
Sodium: 139 mEq/L (ref 135–145)
Total Bilirubin: 0.9 mg/dL (ref 0.2–1.2)
Total Protein: 6.6 g/dL (ref 6.0–8.3)

## 2019-07-27 LAB — URINALYSIS, ROUTINE W REFLEX MICROSCOPIC
Bilirubin Urine: NEGATIVE
Hgb urine dipstick: NEGATIVE
Ketones, ur: NEGATIVE
Leukocytes,Ua: NEGATIVE
Nitrite: NEGATIVE
RBC / HPF: NONE SEEN (ref 0–?)
Specific Gravity, Urine: 1.015 (ref 1.000–1.030)
Total Protein, Urine: NEGATIVE
Urine Glucose: NEGATIVE
Urobilinogen, UA: 0.2 (ref 0.0–1.0)
pH: 7.5 (ref 5.0–8.0)

## 2019-07-27 LAB — CBC
HCT: 43.8 % (ref 39.0–52.0)
Hemoglobin: 15.2 g/dL (ref 13.0–17.0)
MCHC: 34.7 g/dL (ref 30.0–36.0)
MCV: 92.8 fl (ref 78.0–100.0)
Platelets: 208 10*3/uL (ref 150.0–400.0)
RBC: 4.72 Mil/uL (ref 4.22–5.81)
RDW: 12.4 % (ref 11.5–15.5)
WBC: 6.7 10*3/uL (ref 4.0–10.5)

## 2019-07-27 LAB — LIPID PANEL
Cholesterol: 157 mg/dL (ref 0–200)
HDL: 44.8 mg/dL (ref >39.00)
LDL Cholesterol: 87 mg/dL (ref 0–99)
NonHDL: 112.5
Total CHOL/HDL Ratio: 4
Triglycerides: 127 mg/dL (ref 0.0–149.0)
VLDL: 25.4 mg/dL (ref 0.0–40.0)

## 2019-07-27 LAB — TSH: TSH: 4.03 u[IU]/mL (ref 0.35–4.50)

## 2019-07-27 LAB — HEMOGLOBIN A1C: Hgb A1c MFr Bld: 5.3 % (ref 4.6–6.5)

## 2019-07-27 LAB — TESTOSTERONE: Testosterone: 320.53 ng/dL (ref 300.00–890.00)

## 2019-07-27 LAB — VITAMIN B12: Vitamin B-12: 355 pg/mL (ref 211–911)

## 2019-07-27 LAB — LDL CHOLESTEROL, DIRECT: Direct LDL: 89 mg/dL

## 2019-07-27 NOTE — Patient Instructions (Signed)
Health Maintenance After Age 68 After age 39, you are at a higher risk for certain long-term diseases and infections as well as injuries from falls. Falls are a major cause of broken bones and head injuries in people who are older than age 66. Getting regular preventive care can help to keep you healthy and well. Preventive care includes getting regular testing and making lifestyle changes as recommended by your health care provider. Talk with your health care provider about:  Which screenings and tests you should have. A screening is a test that checks for a disease when you have no symptoms.  A diet and exercise plan that is right for you. What should I know about screenings and tests to prevent falls? Screening and testing are the best ways to find a health problem early. Early diagnosis and treatment give you the best chance of managing medical conditions that are common after age 8. Certain conditions and lifestyle choices may make you more likely to have a fall. Your health care provider may recommend:  Regular vision checks. Poor vision and conditions such as cataracts can make you more likely to have a fall. If you wear glasses, make sure to get your prescription updated if your vision changes.  Medicine review. Work with your health care provider to regularly review all of the medicines you are taking, including over-the-counter medicines. Ask your health care provider about any side effects that may make you more likely to have a fall. Tell your health care provider if any medicines that you take make you feel dizzy or sleepy.  Osteoporosis screening. Osteoporosis is a condition that causes the bones to get weaker. This can make the bones weak and cause them to break more easily.  Blood pressure screening. Blood pressure changes and medicines to control blood pressure can make you feel dizzy.  Strength and balance checks. Your health care provider may recommend certain tests to check your  strength and balance while standing, walking, or changing positions.  Foot health exam. Foot pain and numbness, as well as not wearing proper footwear, can make you more likely to have a fall.  Depression screening. You may be more likely to have a fall if you have a fear of falling, feel emotionally low, or feel unable to do activities that you used to do.  Alcohol use screening. Using too much alcohol can affect your balance and may make you more likely to have a fall. What actions can I take to lower my risk of falls? General instructions  Talk with your health care provider about your risks for falling. Tell your health care provider if: ? You fall. Be sure to tell your health care provider about all falls, even ones that seem minor. ? You feel dizzy, sleepy, or off-balance.  Take over-the-counter and prescription medicines only as told by your health care provider. These include any supplements.  Eat a healthy diet and maintain a healthy weight. A healthy diet includes low-fat dairy products, low-fat (lean) meats, and fiber from whole grains, beans, and lots of fruits and vegetables. Home safety  Remove any tripping hazards, such as rugs, cords, and clutter.  Install safety equipment such as grab bars in bathrooms and safety rails on stairs.  Keep rooms and walkways well-lit. Activity   Follow a regular exercise program to stay fit. This will help you maintain your balance. Ask your health care provider what types of exercise are appropriate for you.  If you need a cane or  walker, use it as recommended by your health care provider.  Wear supportive shoes that have nonskid soles. Lifestyle  Do not drink alcohol if your health care provider tells you not to drink.  If you drink alcohol, limit how much you have: ? 0-1 drink a day for women. ? 0-2 drinks a day for men.  Be aware of how much alcohol is in your drink. In the [place] one drink equals one typical bottle of beer (12  oz), one-half glass of wine (5 oz), or one shot of hard liquor (1 oz).  Do not use any products that contain nicotine or tobacco, such as cigarettes and e-cigarettes. If you need help quitting, ask your health care provider. Summary  Having a healthy lifestyle and getting preventive care can help to protect your health and wellness after age 27.  Screening and testing are the best way to find a health problem early and help you avoid having a fall. Early diagnosis and treatment give you the best chance for managing medical conditions that are more common for people who are older than age 13.  Falls are a major cause of broken bones and head injuries in people who are older than age 81. Take precautions to prevent a fall at home.  Work with your health care provider to learn what changes you can make to improve your health and wellness and to prevent falls. This information is not intended to replace advice given to you by your health care provider. Make sure you discuss any questions you have with your health care provider. Document Revised: 05/06/2018 Document Reviewed: 11/26/2016 Elsevier Patient Education  2020 [place] 65 Years and Older, Male Preventive care refers to lifestyle choices and visits with your health care provider that can promote health and wellness. This includes:  A yearly physical exam. This is also called an annual well check.  Regular dental and eye exams.  Immunizations.  Screening for certain conditions.  Healthy lifestyle choices, such as diet and exercise. What can I expect for my preventive care visit? Physical exam Your health care provider will check:  Height and weight. These may be used to calculate body mass index (BMI), which is a measurement that tells if you are at a healthy weight.  Heart rate and blood pressure.  Your skin for abnormal spots. Counseling Your health care provider may ask you questions  about:  Alcohol, tobacco, and drug use.  Emotional well-being.  Home and relationship well-being.  Sexual activity.  Eating habits.  History of falls.  Memory and ability to understand (cognition).  Work and work Statistician. What immunizations do I need?  Influenza (flu) vaccine  This is recommended every year. Tetanus, diphtheria, and pertussis (Tdap) vaccine  You may need a Td booster every 10 years. Varicella (chickenpox) vaccine  You may need this vaccine if you have not already been vaccinated. Zoster (shingles) vaccine  You may need this after age 76. Pneumococcal conjugate (PCV13) vaccine  One dose is recommended after age 49. Pneumococcal polysaccharide (PPSV23) vaccine  One dose is recommended after age 66. Measles, mumps, and rubella (MMR) vaccine  You may need at least one dose of MMR if you were born in 1957 or later. You may also need a second dose. Meningococcal conjugate (MenACWY) vaccine  You may need this if you have certain conditions. Hepatitis A vaccine  You may need this if you have certain conditions or if you travel or work in places where  you may be exposed to hepatitis A. Hepatitis B vaccine  You may need this if you have certain conditions or if you travel or work in places where you may be exposed to hepatitis B. Haemophilus influenzae type b (Hib) vaccine  You may need this if you have certain conditions. You may receive vaccines as individual doses or as more than one vaccine together in one shot (combination vaccines). Talk with your health care provider about the risks and benefits of combination vaccines. What tests do I need? Blood tests  Lipid and cholesterol levels. These may be checked every 5 years, or more frequently depending on your overall health.  Hepatitis C test.  Hepatitis B test. Screening  Lung cancer screening. You may have this screening every year starting at age 71 if you have a 30-pack-year history of  smoking and currently smoke or have quit within the past 15 years.  Colorectal cancer screening. All adults should have this screening starting at age 71 and continuing until age 73. Your health care provider may recommend screening at age 25 if you are at increased risk. You will have tests every 1-10 years, depending on your results and the type of screening test.  Prostate cancer screening. Recommendations will vary depending on your family history and other risks.  Diabetes screening. This is done by checking your blood sugar (glucose) after you have not eaten for a while (fasting). You may have this done every 1-3 years.  Abdominal aortic aneurysm (AAA) screening. You may need this if you are a current or former smoker.  Sexually transmitted disease (STD) testing. Follow these instructions at home: Eating and drinking  Eat a diet that includes fresh fruits and vegetables, whole grains, lean protein, and low-fat dairy products. Limit your intake of foods with high amounts of sugar, saturated fats, and salt.  Take vitamin and mineral supplements as recommended by your health care provider.  Do not drink alcohol if your health care provider tells you not to drink.  If you drink alcohol: ? Limit how much you have to 0-2 drinks a day. ? Be aware of how much alcohol is in your drink. In the [place] one drink equals one 12 oz bottle of beer (355 mL), one 5 oz glass of wine (148 mL), or one 1 oz glass of hard liquor (44 mL). Lifestyle  Take daily care of your teeth and gums.  Stay active. Exercise for at least 30 minutes on 5 or more days each week.  Do not use any products that contain nicotine or tobacco, such as cigarettes, e-cigarettes, and chewing tobacco. If you need help quitting, ask your health care provider.  If you are sexually active, practice safe sex. Use a condom or other form of protection to prevent STIs (sexually transmitted infections).  Talk with your health care  provider about taking a low-dose aspirin or statin. What's next?  Visit your health care provider once a year for a well check visit.  Ask your health care provider how often you should have your eyes and teeth checked.  Stay up to date on all vaccines. This information is not intended to replace advice given to you by your health care provider. Make sure you discuss any questions you have with your health care provider. Document Revised: 01/07/2018 Document Reviewed: 01/07/2018 Elsevier Patient Education  2020 [place].  Benign Prostatic Hyperplasia  Benign prostatic hyperplasia (BPH) is an enlarged prostate gland that is caused by the normal aging process and  not by cancer. The prostate is a walnut-sized gland that is involved in the production of semen. It is located in front of the rectum and below the bladder. The bladder stores urine and the urethra is the tube that carries the urine out of the body. The prostate may get bigger as a man gets older. An enlarged prostate can press on the urethra. This can make it harder to pass urine. The build-up of urine in the bladder can cause infection. Back pressure and infection may progress to bladder damage and kidney (renal) failure. What are the causes? This condition is part of a normal aging process. However, not all men develop problems from this condition. If the prostate enlarges away from the urethra, urine flow will not be blocked. If it enlarges toward the urethra and compresses it, there will be problems passing urine. What increases the risk? This condition is more likely to develop in men over the age of 62 years. What are the signs or symptoms? Symptoms of this condition include:  Getting up often during the night to urinate.  Needing to urinate frequently during the day.  Difficulty starting urine flow.  Decrease in size and strength of your urine stream.  Leaking (dribbling) after urinating.  Inability to pass urine.  This needs immediate treatment.  Inability to completely empty your bladder.  Pain when you pass urine. This is more common if there is also an infection.  Urinary tract infection (UTI). How is this diagnosed? This condition is diagnosed based on your medical history, a physical exam, and your symptoms. Tests will also be done, such as:  A post-void bladder scan. This measures any amount of urine that may remain in your bladder after you finish urinating.  A digital rectal exam. In a rectal exam, your health care provider checks your prostate by putting a lubricated, gloved finger into your rectum to feel the back of your prostate gland. This exam detects the size of your gland and any abnormal lumps or growths.  An exam of your urine (urinalysis).  A prostate specific antigen (PSA) screening. This is a blood test used to screen for prostate cancer.  An ultrasound. This test uses sound waves to electronically produce a picture of your prostate gland. Your health care provider may refer you to a specialist in kidney and prostate diseases (urologist). How is this treated? Once symptoms begin, your health care provider will monitor your condition (active surveillance or watchful waiting). Treatment for this condition will depend on the severity of your condition. Treatment may include:  Observation and yearly exams. This may be the only treatment needed if your condition and symptoms are mild.  Medicines to relieve your symptoms, including: ? Medicines to shrink the prostate. ? Medicines to relax the muscle of the prostate.  Surgery in severe cases. Surgery may include: ? Prostatectomy. In this procedure, the prostate tissue is removed completely through an open incision or with a laparoscope or robotics. ? Transurethral resection of the prostate (TURP). In this procedure, a tool is inserted through the opening at the tip of the penis (urethra). It is used to cut away tissue of the inner  core of the prostate. The pieces are removed through the same opening of the penis. This removes the blockage. ? Transurethral incision (TUIP). In this procedure, small cuts are made in the prostate. This lessens the prostate's pressure on the urethra. ? Transurethral microwave thermotherapy (TUMT). This procedure uses microwaves to create heat. The heat destroys  and removes a small amount of prostate tissue. ? Transurethral needle ablation (TUNA). This procedure uses radio frequencies to destroy and remove a small amount of prostate tissue. ? Interstitial laser coagulation ([place]). This procedure uses a laser to destroy and remove a small amount of prostate tissue. ? Transurethral electrovaporization (TUVP). This procedure uses electrodes to destroy and remove a small amount of prostate tissue. ? Prostatic urethral lift. This procedure inserts an implant to push the lobes of the prostate away from the urethra. Follow these instructions at home:  Take over-the-counter and prescription medicines only as told by your health care provider.  Monitor your symptoms for any changes. Contact your health care provider with any changes.  Avoid drinking large amounts of liquid before going to bed or out in public.  Avoid or reduce how much caffeine or alcohol you drink.  Give yourself time when you urinate.  Keep all follow-up visits as told by your health care provider. This is important. Contact a health care provider if:  You have unexplained back pain.  Your symptoms do not get better with treatment.  You develop side effects from the medicine you are taking.  Your urine becomes very dark or has a bad smell.  Your lower abdomen becomes distended and you have trouble passing your urine. Get help right away if:  You have a fever or chills.  You suddenly cannot urinate.  You feel lightheaded, or very dizzy, or you faint.  There are large amounts of blood or clots in the urine.  Your  urinary problems become hard to manage.  You develop moderate to severe low back or flank pain. The flank is the side of your body between the ribs and the hip. These symptoms may represent a serious problem that is an emergency. Do not wait to see if the symptoms will go away. Get medical help right away. Call your local emergency services (911 in the [place] Do not drive yourself to the hospital. Summary  Benign prostatic hyperplasia (BPH) is an enlarged prostate that is caused by the normal aging process and not by cancer.  An enlarged prostate can press on the urethra. This can make it hard to pass urine.  This condition is part of a normal aging process and is more likely to develop in men over the age of 13 years.  Get help right away if you suddenly cannot urinate. This information is not intended to replace advice given to you by your health care provider. Make sure you discuss any questions you have with your health care provider. Document Revised: 12/08/2017 Document Reviewed: 02/18/2016 Elsevier Patient Education  2020 [employer].

## 2019-07-27 NOTE — Progress Notes (Addendum)
Established Patient Office Visit  Subjective:  Patient ID: Robert Meyers, male    DOB: Aug 11, 1950  Age: 69 y.o. MRN: [id number]  CC:  Chief Complaint  Patient presents with   Annual Exam    CPE, patient states that allergies will sometimes make him very tired.     HPI Robert Meyers presents for complete physical exam, follow-up of his elevated cholesterol.  As noted some fatigue in the springtime when he takes his allergy medicines.  Has been using over-the-counter allergy medicine.  Has been frustrated with the world at this point.  Semiretired.  [place]  Staying quite active.  Continues to experience some nocturia but does consume fluids prior to going to bed.  Occasionally feels some discomfort in the perineal area.  Decided not to try the Flomax at this time.  Ongoing sense of fatigue and decreased libido.  Interested in having his T checked.  Past Medical History:  Diagnosis Date   Allergy    Cancer ([place])    GERD (gastroesophageal reflux disease)     Past Surgical History:  Procedure Laterality Date   bilateral inguinal hernia repair     deep axillary lymphnode biopsy     excision left radial head     excision of spermatocele with epididyectomy     melanoma wide excision surgery right upper back (stage 2)      Family History  Problem Relation Age of Onset   Hyperlipidemia Mother    Cancer Father     Social History   Socioeconomic History   Marital status: Married    Spouse name: Not on file   Number of children: Not on file   Years of education: Not on file   Highest education level: Not on file  Occupational History   Not on file  Tobacco Use   Smoking status: Never Smoker   Smokeless tobacco: Never Used  Substance and Sexual Activity   Alcohol use: Not on file   Drug use: Not on file   Sexual activity: Not on file  Other Topics Concern   Not on file  Social History Narrative   Not on file   Social  Determinants of Health   Financial Resource Strain:    Difficulty of Paying Living Expenses:   Food Insecurity:    Worried About Charity fundraiser in the Last Year:    Arboriculturist in the Last Year:   Transportation Needs:    Film/video editor (Medical):    Lack of Transportation (Non-Medical):   Physical Activity:    Days of Exercise per Week:    Minutes of Exercise per Session:   Stress:    Feeling of Stress :   Social Connections:    Frequency of Communication with Friends and Family:    Frequency of Social Gatherings with Friends and Family:    Attends Religious Services:    Active Member of Clubs or Organizations:    Attends Music therapist:    Marital Status:   Intimate Partner Violence:    Fear of Current or Ex-Partner:    Emotionally Abused:    Physically Abused:    Sexually Abused:     Outpatient Medications Prior to Visit  Medication Sig Dispense Refill   aspirin EC 81 MG tablet Take 81 mg by mouth daily.     omeprazole (PRILOSEC) 20 MG capsule Take 20 mg by mouth daily.     atorvastatin (  LIPITOR) 20 MG tablet TAKE 1 TABLET BY MOUTH EVERY DAY 30 tablet 0   doxycycline (VIBRA-TABS) 100 MG tablet Take 1 tablet (100 mg total) by mouth 2 (two) times daily. (Patient not taking: Reported on 07/27/2019) 20 tablet 0   tamsulosin (FLOMAX) 0.4 MG CAPS capsule Take 1 capsule (0.4 mg total) by mouth daily. (Patient not taking: Reported on 12/10/2018) 30 capsule 3   No facility-administered medications prior to visit.    Allergies  Allergen Reactions   Levofloxacin Hives and Rash    Hives Hives Hives     ROS Review of Systems  Constitutional: Negative.   HENT: Negative.   Eyes: Negative for photophobia and visual disturbance.  Respiratory: Negative.   Cardiovascular: Negative.   Gastrointestinal: Negative.   Endocrine: Negative for polyphagia.  Genitourinary: Positive for difficulty urinating. Negative for frequency  and urgency.  Musculoskeletal: Negative for gait problem and joint swelling.  Skin: Negative for pallor and rash.  Allergic/Immunologic: Negative for immunocompromised state.  Neurological: Negative for tremors and speech difficulty.  Hematological: Does not bruise/bleed easily.      Objective:    Physical Exam Vitals and nursing note reviewed.  Constitutional:      General: He is not in acute distress.    Appearance: Normal appearance. He is normal weight. He is not ill-appearing, toxic-appearing or diaphoretic.  HENT:     Head: Normocephalic and atraumatic.     Right Ear: Tympanic membrane, ear canal and external ear normal. There is no impacted cerumen.     Left Ear: Tympanic membrane, ear canal and external ear normal. There is no impacted cerumen.     Mouth/Throat:     Mouth: Mucous membranes are moist.     Pharynx: Oropharynx is clear. No oropharyngeal exudate or posterior oropharyngeal erythema.  Eyes:     General: No scleral icterus.       Right eye: No discharge.        Left eye: No discharge.     Extraocular Movements: Extraocular movements intact.     Conjunctiva/sclera: Conjunctivae normal.  Cardiovascular:     Rate and Rhythm: Normal rate and regular rhythm.     Pulses: Normal pulses.     Heart sounds: Normal heart sounds.  Pulmonary:     Effort: Pulmonary effort is normal.     Breath sounds: Normal breath sounds.  Abdominal:     General: Abdomen is flat. Bowel sounds are normal. There is no distension.     Palpations: Abdomen is soft. There is no mass.     Tenderness: There is no abdominal tenderness. There is no guarding or rebound.     Hernia: No hernia is present.  Genitourinary:    Prostate: Not enlarged, not tender and no nodules present.     Rectum: Guaiac result negative. No mass, tenderness, anal fissure, external hemorrhoid or internal hemorrhoid. Normal anal tone.  Musculoskeletal:     Right shoulder: Tenderness present. No swelling, deformity or  effusion. Normal range of motion.       Arms:     Cervical back: No rigidity or tenderness.     Right lower leg: No edema.     Left lower leg: No edema.  Lymphadenopathy:     Cervical: No cervical adenopathy.  Skin:    General: Skin is warm and dry.     Findings: No lesion or rash.  Neurological:     General: No focal deficit present.     Mental Status: He is alert  and oriented to person, place, and time.  Psychiatric:        Mood and Affect: Mood normal.        Behavior: Behavior normal.     BP 126/70    Pulse 66    Temp 97.6 F (36.4 C) (Tympanic)    Ht 5\' 6"  (1.676 m)    Wt 172 lb (78 kg)    SpO2 97%    BMI 27.76 kg/m  Wt Readings from Last 3 Encounters:  07/27/19 172 lb (78 kg)  09/02/17 168 lb 8 oz (76.4 kg)     Health Maintenance Due  Topic Date Due   Hepatitis C Screening  Never done   COVID-19 Vaccine (1) Never done   TETANUS/TDAP  Never done   COLONOSCOPY  Never done   PNA vac Low Risk Adult (1 of 2 - PCV13) Never done    There are no preventive care reminders to display for this patient.  Lab Results  Component Value Date   TSH 4.03 07/27/2019   Lab Results  Component Value Date   WBC 6.7 07/27/2019   HGB 15.2 07/27/2019   HCT 43.8 07/27/2019   MCV 92.8 07/27/2019   PLT 208.0 07/27/2019   Lab Results  Component Value Date   NA 139 07/27/2019   K 3.9 07/27/2019   CO2 29 07/27/2019   GLUCOSE 92 07/27/2019   BUN 19 07/27/2019   CREATININE 1.01 07/27/2019   BILITOT 0.9 07/27/2019   ALKPHOS 48 07/27/2019   AST 24 07/27/2019   ALT 30 07/27/2019   PROT 6.6 07/27/2019   ALBUMIN 4.8 07/27/2019   CALCIUM 9.4 07/27/2019   GFR 73.53 07/27/2019   Lab Results  Component Value Date   CHOL 157 07/27/2019   Lab Results  Component Value Date   HDL 44.80 07/27/2019   Lab Results  Component Value Date   LDLCALC 87 07/27/2019   Lab Results  Component Value Date   TRIG 127.0 07/27/2019   Lab Results  Component Value Date   CHOLHDL 4  07/27/2019   Lab Results  Component Value Date   HGBA1C 5.3 07/27/2019      Assessment & Plan:   Problem List Items Addressed This Visit      Other   Healthcare maintenance   Relevant Orders   CBC (Completed)   Comprehensive metabolic panel (Completed)   Urinalysis, Routine w reflex microscopic (Completed)   PSA   Benign prostatic hyperplasia with weak urinary stream   Elevated LDL cholesterol level   Relevant Orders   LDL cholesterol, direct (Completed)   Lipid panel (Completed)   Fatigue   Relevant Orders   TSH (Completed)   Vitamin B12 (Completed)   VITAMIN D 25 Hydroxy (Vit-D Deficiency, Fractures) (Completed)   Libido, decreased - Primary   Relevant Orders   Testosterone (Completed)   Right shoulder pain   Elevated glucose   Relevant Orders   Hemoglobin A1c (Completed)      No orders of the defined types were placed in this encounter.   Follow-up: Return in about 6 months (around 01/26/2020), or if symptoms worsen or fail to improve.   Discussed using SSRI and patient will think about it.  Given information on health maintenance and disease prevention as well as BPH.  Advised patient to try some Voltaren gel for her shoulder. [name], MD

## 2019-07-28 ENCOUNTER — Other Ambulatory Visit: Payer: Self-pay | Admitting: Family Medicine

## 2019-07-28 DIAGNOSIS — E7800 Pure hypercholesterolemia, unspecified: Secondary | ICD-10-CM

## 2019-07-28 NOTE — Telephone Encounter (Signed)
Last OV 07/27/19 Last fill 06/15/19  #30/0

## 2019-07-28 NOTE — Addendum Note (Signed)
Addended by: Jon Billings on: 07/28/2019 04:43 PM   Modules accepted: Orders

## 2019-07-29 ENCOUNTER — Other Ambulatory Visit (INDEPENDENT_AMBULATORY_CARE_PROVIDER_SITE_OTHER): Payer: Medicare Other

## 2019-07-29 DIAGNOSIS — Z0000 Encounter for general adult medical examination without abnormal findings: Secondary | ICD-10-CM

## 2019-07-29 DIAGNOSIS — N401 Enlarged prostate with lower urinary tract symptoms: Secondary | ICD-10-CM | POA: Diagnosis not present

## 2019-07-29 LAB — PSA: PSA: 1.26 ng/mL (ref 0.10–4.00)

## 2019-08-04 ENCOUNTER — Telehealth: Payer: Self-pay | Admitting: Family Medicine

## 2019-08-04 NOTE — Telephone Encounter (Signed)
Patient is calling and wanted to see if his lab results were back, please advise. CB is [phone]

## 2019-08-21 ENCOUNTER — Other Ambulatory Visit: Payer: Self-pay | Admitting: Family Medicine

## 2019-08-21 DIAGNOSIS — E7800 Pure hypercholesterolemia, unspecified: Secondary | ICD-10-CM

## 2019-08-30 ENCOUNTER — Other Ambulatory Visit: Payer: Self-pay

## 2019-08-30 ENCOUNTER — Encounter: Payer: Self-pay | Admitting: Family Medicine

## 2019-08-30 ENCOUNTER — Ambulatory Visit (INDEPENDENT_AMBULATORY_CARE_PROVIDER_SITE_OTHER): Payer: Medicare Other | Admitting: Family Medicine

## 2019-08-30 VITALS — BP 120/68 | HR 71 | Temp 97.5°F | Ht 66.0 in | Wt 179.2 lb

## 2019-08-30 DIAGNOSIS — M25511 Pain in right shoulder: Secondary | ICD-10-CM | POA: Diagnosis not present

## 2019-08-30 DIAGNOSIS — M7551 Bursitis of right shoulder: Secondary | ICD-10-CM

## 2019-08-30 MED ORDER — DICLOFENAC SODIUM 1 % EX GEL: CUTANEOUS | 1 refills | Status: DC

## 2019-08-30 NOTE — Patient Instructions (Signed)
Joint Steroid Injection A joint steroid injection is a procedure to relieve swelling and pain in a joint. Steroids are medicines that reduce inflammation. In this procedure, your health care provider uses a syringe and a needle to inject a steroid medicine into a painful and inflamed joint. A pain-relieving medicine (anesthetic) may be injected along with the steroid. In some cases, your health care provider may use an imaging technique such as ultrasound or fluoroscopy to guide the injection. Joints that are often treated with steroid injections include the knee, shoulder, hip, and spine. These injections may also be used in the elbow, ankle, and joints of the hands or feet. You may have joint steroid injections as part of your treatment for inflammation caused by:  Gout.  Rheumatoid arthritis.  Advanced wear-and-tear arthritis (osteoarthritis).  Tendinitis.  Bursitis. Joint steroid injections may be repeated, but having them too often can damage a joint or the skin over the joint. You should not have joint steroid injections less than 6 weeks apart or more than four times a year. Tell a health care provider about:  Any allergies you have.  All medicines you are taking, including vitamins, herbs, eye drops, creams, and over-the-counter medicines.  Any problems you or family members have had with anesthetic medicines.  Any blood disorders you have.  Any surgeries you have had.  Any medical conditions you have.  Whether you are pregnant or may be pregnant. What are the risks? Generally, this is a safe treatment. However, problems may occur, including:  Infection.  Bleeding.  Allergic reactions to medicines.  Damage to the joint or tissues around the joint.  Thinning of skin or loss of skin color over the joint.  Temporary flushing of the face or chest.  Temporary increase in pain.  Temporary increase in blood sugar.  Failure to relieve inflammation or pain. What  happens before the treatment?  You may have imaging tests of your joint.  Ask your health care provider about: ? Changing or stopping your regular medicines. This is especially important if you are taking diabetes medicines or blood thinners. ? Taking medicines such as aspirin and ibuprofen. These medicines can thin your blood. Do not take these medicines unless your health care provider tells you to take them. ? Taking over-the-counter medicines, vitamins, herbs, and supplements.  Ask your health care provider if you can drive yourself home after the procedure. What happens during the treatment?   Your health care provider will position you for the injection and locate the injection site over your joint.  The skin over the joint will be cleaned with a germ-killing soap.  Your health care provider may: ? Spray a numbing solution (topical anesthetic) over the injection site. ? Inject a local anesthetic under the skin above your joint.  The needle will be placed through your skin into your joint. Your health care provider may use imaging to guide the needle to the right spot for the injection. If imaging is used, a special contrast dye may be injected to confirm that the needle is in the correct location.  The steroid medicine will be injected into your joint.  Anesthetic may be injected along with the steroid. This may be a medicine that relieves pain for a short time (short-acting anesthetic) or for a longer time (long-acting anesthetic).  The needle will be removed, and an adhesive bandage (dressing) will be placed over the injection site. The procedure may vary among health care providers and hospitals. What can I   expect after the treatment?  You will be able to go home after the treatment.  It is normal to feel slight flushing for a few days after the injection.  After the treatment, it is common to have an increase in joint pain after the anesthetic has worn off. This may  happen about an hour after a short-acting anesthetic or about 8 hours after a longer-acting anesthetic.  You should begin to feel relief from joint pain and swelling after 24 to 48 hours. Follow these instructions at home: Injection site care  Leave the adhesive dressing over your injection site in place until your health care provider says you can remove it.  Check your injection site every day for signs of infection. Check for: ? Redness, swelling, or pain. ? Fluid or blood. ? Warmth. ? Pus or a bad smell. Activity  Return to your normal activities as told by your health care provider. Ask your health care provider what activities are safe for you. You may be asked to limit activities that put stress on the joint for a few days.  Do joint exercises as told by your health care provider.  Do not take baths, swim, or use a hot tub until your health care provider approves. Managing pain, stiffness, and swelling   If directed, put ice on the joint. ? Put ice in a plastic bag. ? Place a towel between your skin and the bag. ? Leave the ice on for 20 minutes, 2-3 times a day.  Raise (elevate) your joint above the level of your heart when you are sitting or lying down. General instructions  Take over-the-counter and prescription medicines only as told by your health care provider.  Do not use any products that contain nicotine or tobacco, such as cigarettes, e-cigarettes, and chewing tobacco. These can delay joint healing. If you need help quitting, ask your health care provider.  If you have diabetes, be aware that your blood sugar may be slightly elevated for several days after the injection.  Keep all follow-up visits as told by your health care provider. This is important. Contact a health care provider if you have:  Chills or a fever.  Any signs of infection at your injection site.  Increased pain or swelling or no relief after 2 days. Summary  A joint steroid injection  is a treatment to relieve pain and swelling in a joint.  Steroids are medicines that reduce inflammation. Your health care provider may add an anesthetic along with the steroid.  You may have joint steroid injections as part of your arthritis treatment.  Joint steroid injections may be repeated, but having them too often can damage a joint or the skin over the joint.  Contact your health care provider if you have a fever, chills, or signs of infection or if you get no relief from joint pain or swelling. This information is not intended to replace advice given to you by your health care provider. Make sure you discuss any questions you have with your health care provider. Document Revised: 09/15/2017 Document Reviewed: 09/15/2017 Elsevier Patient Education  [place]. Diclofenac skin gel What is this medicine? DICLOFENAC (dye KLOE fen ak) is a non-steroidal anti-inflammatory drug (NSAID). The 1% skin gel is used to treat osteoarthritis of the hands or knees. The 3% skin gel is used to treat actinic keratosis. This medicine may be used for other purposes; ask your health care provider or pharmacist if you have questions. COMMON BRAND NAME(S): DSG  Sherrye Payor, Solaravix, Office manager, Voltaren Arthritis, Voltaren Gel What should I tell my health care provider before I take this medicine? They need to know if you have any of these conditions:  asthma  bleeding problems  coronary artery bypass graft (CABG) surgery within the past 2 weeks  heart disease  high blood pressure  if you frequently drink alcohol containing drinks  kidney disease  liver disease  open or infected skin  stomach problems  an unusual or allergic reaction to diclofenac, aspirin, other NSAIDs, other medicines, benzyl alcohol (3% gel only), foods, dyes, or preservatives  pregnant or trying to get pregnant  breast-feeding How should I use this medicine? This medicine is for external use only. Follow the  directions on the prescription label. Wash hands before and after use. Do not get this medicine in your eyes. If you do, rinse out with plenty of cool tap water. Use your doses at regular intervals. Do not use your medicine more often than directed. A special MedGuide will be given to you by the pharmacist with each prescription and refill of the 1% gel. Be sure to read this information carefully each time. Talk to your pediatrician regarding the use of this medicine in children. Special care may be needed. The 3% gel is not approved for use in children. Overdosage: If you think you have taken too much of this medicine contact a poison control center or emergency room at once. NOTE: This medicine is only for you. Do not share this medicine with others. What if I miss a dose? If you miss a dose, use it as soon as you can. If it is almost time for your next dose, use only that dose. Do not use double or extra doses. What may interact with this medicine?  aspirin  NSAIDs, medicines for pain and inflammation, like ibuprofen or naproxen Do not use any other skin products without telling your doctor or health care professional. This list may not describe all possible interactions. Give your health care provider a list of all the medicines, herbs, non-prescription drugs, or dietary supplements you use. Also tell them if you smoke, drink alcohol, or use illegal drugs. Some items may interact with your medicine. What should I watch for while using this medicine? Tell your doctor or healthcare provider if your symptoms do not start to get better or if they get worse. You will need to follow up with your healthcare provider to monitor your progress. You may need to be treated for up to 3 months if you are using the 3% gel, but the full effect may not occur until 1 month after stopping treatment. If you develop a severe skin reaction, contact your doctor or healthcare provider immediately. This medicine may  cause serious skin reactions. They can happen weeks to months after starting the medicine. Contact your healthcare provider right away if you notice fevers or flu-like symptoms with a rash. The rash may be red or purple and then turn into blisters or peeling of the skin. Or, you might notice a red rash with swelling of the face, lips or lymph nodes in your neck or under your arms. This medicine can make you more sensitive to the sun. Keep out of the sun. If you cannot avoid being in the sun, wear protective clothing and use sunscreen. Do not use sun lamps or tanning beds/booths. Do not take medicines such as ibuprofen and naproxen with this medicine. Side effects such as stomach upset, nausea, or ulcers  may be more likely to occur. Many medicines available without a prescription should not be taken with this medicine. This medicine does not prevent heart attack or stroke. In fact, this medicine may increase the chance of a heart attack or stroke. The chance may increase with longer use of this medicine and in people who have heart disease. If you take aspirin to prevent heart attack or stroke, talk with your doctor or healthcare provider. This medicine can cause ulcers and bleeding in the stomach and intestines at any time during treatment. Do not smoke cigarettes or drink alcohol. These increase irritation to your stomach and can make it more susceptible to damage from this medicine. Ulcers and bleeding can happen without warning symptoms and can cause death. You may get drowsy or dizzy. Do not drive, use machinery, or do anything that needs mental alertness until you know how this medicine affects you. Do not stand or sit up quickly, especially if you are an older patient. This reduces the risk of dizzy or fainting spells. This medicine can cause you to bleed more easily. Try to avoid damage to your teeth and gums when you brush or floss your teeth. What side effects may I notice from receiving this  medicine? Side effects that you should report to your doctor or health care professional as soon as possible:  allergic reactions like skin rash, itching or hives, swelling of the face, lips, or tongue  black or bloody stools, blood in the urine or vomit  blurred vision  chest pain  difficulty breathing or wheezing  nausea or vomiting  rash, fever, and swollen lymph nodes  redness, blistering, peeling or loosening of the skin, including inside the mouth  slurred speech or weakness on one side of the body  trouble passing urine or change in the amount of urine  unexplained weight gain or swelling  unusually weak or tired  yellowing of eyes or skin Side effects that usually do not require medical attention (report to your doctor or health care professional if they continue or are bothersome):  dizziness  dry skin  headache  heartburn  increased sensitivity to the sun  stomach pain  tingling at the application site This list may not describe all possible side effects. Call your doctor for medical advice about side effects. You may report side effects to FDA at 1-800-FDA-1088. Where should I keep my medicine? Keep out of the reach of children. Store the 1% gel at room temperature between 15 and 30 degrees C (59 and 86 degrees F). Store the 3% gel at room temperature between 20 and 25 degrees C (68 and 77 degrees F). Protect from light. Throw away any unused medicine after the expiration date. NOTE: This sheet is a summary. It may not cover all possible information. If you have questions about this medicine, talk to your doctor, pharmacist, or health care provider.  2020 Elsevier/Gold Standard (2018-03-31 13:05:18)

## 2019-08-30 NOTE — Progress Notes (Signed)
Established Patient Office Visit  Subjective:  Patient ID: Robert Meyers, male    DOB: 1950/04/21  Age: 69 y.o. MRN: [id number]  CC:  Chief Complaint  Patient presents with  . Pain    C/O right shoulder pain x 1 year very uncomfortable when sleeping.     HPI Robert Meyers presents for follow-up of an ongoing history of right shoulder pain.  Right-hand-dominant.  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  It is painful when he rolls over on it at night.  Past Medical History:  Diagnosis Date  . Allergy   . Cancer ([place])   . GERD (gastroesophageal reflux disease)     Past Surgical History:  Procedure Laterality Date  . bilateral inguinal hernia repair    . deep axillary lymphnode biopsy    . excision left radial head    . excision of spermatocele with epididyectomy    . melanoma wide excision surgery right upper back (stage 2)      Family History  Problem Relation Age of Onset  . Hyperlipidemia Mother   . Cancer Father     Social History   Socioeconomic History  . Marital status: Married    Spouse name: Not on file  . Number of children: Not on file  . Years of education: Not on file  . Highest education level: Not on file  Occupational History  . Not on file  Tobacco Use  . Smoking status: Never Smoker  . Smokeless tobacco: Never Used  Substance and Sexual Activity  . Alcohol use: Not on file  . Drug use: Not on file  . Sexual activity: Not on file  Other Topics Concern  . Not on file  Social History Narrative  . Not on file   Social Determinants of Health   Financial Resource Strain:   . Difficulty of Paying Living Expenses:   Food Insecurity:   . Worried About Charity fundraiser in the Last Year:   . Arboriculturist in the Last Year:   Transportation Needs:   . Film/video editor (Medical):   Marland Kitchen Lack of Transportation (Non-Medical):   Physical Activity:   . Days of Exercise per Week:   . Minutes of Exercise per Session:     Stress:   . Feeling of Stress :   Social Connections:   . Frequency of Communication with Friends and Family:   . Frequency of Social Gatherings with Friends and Family:   . Attends Religious Services:   . Active Member of Clubs or Organizations:   . Attends Archivist Meetings:   Marland Kitchen Marital Status:   Intimate Partner Violence:   . Fear of Current or Ex-Partner:   . Emotionally Abused:   Marland Kitchen Physically Abused:   . Sexually Abused:     Outpatient Medications Prior to Visit  Medication Sig Dispense Refill  . aspirin EC 81 MG tablet Take 81 mg by mouth daily.    Marland Kitchen atorvastatin (LIPITOR) 20 MG tablet TAKE 1 TABLET BY MOUTH EVERY DAY 30 tablet 0  . omeprazole (PRILOSEC) 20 MG capsule Take 20 mg by mouth daily.    Marland Kitchen doxycycline (VIBRA-TABS) 100 MG tablet Take 1 tablet (100 mg total) by mouth 2 (two) times daily. (Patient not taking: Reported on 08/30/2019) 20 tablet 0  . tamsulosin (FLOMAX) 0.4 MG CAPS capsule Take 1 capsule (0.4 mg total) by mouth daily. (Patient not taking: Reported on 12/10/2018) 30 capsule  3   No facility-administered medications prior to visit.    Allergies  Allergen Reactions  . Levofloxacin Hives and Rash    Hives Hives Hives     ROS Review of Systems  Constitutional: Negative.   Respiratory: Negative.   Cardiovascular: Negative.   Gastrointestinal: Negative.   Musculoskeletal: Positive for arthralgias.  Skin: Negative.   Neurological: Negative for weakness and numbness.  Psychiatric/Behavioral: Negative.       Objective:    Physical Exam Constitutional:      Appearance: Normal appearance. He is normal weight.  HENT:     Head: Normocephalic and atraumatic.  Eyes:     General: No scleral icterus.       Right eye: No discharge.        Left eye: No discharge.     Conjunctiva/sclera: Conjunctivae normal.  Pulmonary:     Effort: Pulmonary effort is normal.  Musculoskeletal:     Right shoulder: Tenderness present. No swelling,  deformity, effusion or laceration. Normal range of motion. Normal strength.       Arms:  Skin:    General: Skin is warm and dry.  Neurological:     Mental Status: He is alert and oriented to person, place, and time.  Psychiatric:        Mood and Affect: Mood normal.        Behavior: Behavior normal.   Aspiration/Injection Procedure Note Robert Meyers [id number] October 10, 1950  Procedure: Injection Indications: bursitis   Procedure Details Consent: Risks of procedure as well as the alternatives and risks of each were explained to the (patient/caregiver).  Consent for procedure obtained. Time Out: Verified patient identification, verified procedure, site/side was marked, verified correct patient position, special equipment/implants available, medications/allergies/relevent history reviewed, required imaging and test results available.  Performed  Time out.  Posterior labral shoulder was identified and marked.  Cleansed with Betadine x3.  Injected with 1 cc each of 2% lidocaine without and 0.25% Marcaine, as well as 80 mg of Depo-Medrol.  Patient did tolerate procedure well. Estimated blood loss: 0  [name] 08/30/2019, 5:01 PM  BP 120/68   Pulse 71   Temp (!) 97.5 F (36.4 C) (Tympanic)   Ht 5\' 6"  (1.676 m)   Wt 179 lb 3.2 oz (81.3 kg)   SpO2 96%   BMI 28.92 kg/m  Wt Readings from Last 3 Encounters:  08/30/19 179 lb 3.2 oz (81.3 kg)  07/27/19 172 lb (78 kg)  09/02/17 168 lb 8 oz (76.4 kg)     Health Maintenance Due  Topic Date Due  . Hepatitis C Screening  Never done  . COVID-19 Vaccine (1) Never done  . TETANUS/TDAP  Never done  . COLONOSCOPY  Never done  . PNA vac Low Risk Adult (1 of 2 - PCV13) Never done  . INFLUENZA VACCINE  08/28/2019    There are no preventive care reminders to display for this patient.  Lab Results  Component Value Date   TSH 4.03 07/27/2019   Lab Results  Component Value Date   WBC 6.7 07/27/2019   HGB 15.2 07/27/2019    HCT 43.8 07/27/2019   MCV 92.8 07/27/2019   PLT 208.0 07/27/2019   Lab Results  Component Value Date   NA 139 07/27/2019   K 3.9 07/27/2019   CO2 29 07/27/2019   GLUCOSE 92 07/27/2019   BUN 19 07/27/2019   CREATININE 1.01 07/27/2019   BILITOT 0.9 07/27/2019   ALKPHOS 48 07/27/2019   AST 24  07/27/2019   ALT 30 07/27/2019   PROT 6.6 07/27/2019   ALBUMIN 4.8 07/27/2019   CALCIUM 9.4 07/27/2019   GFR 73.53 07/27/2019   Lab Results  Component Value Date   CHOL 157 07/27/2019   Lab Results  Component Value Date   HDL 44.80 07/27/2019   Lab Results  Component Value Date   LDLCALC 87 07/27/2019   Lab Results  Component Value Date   TRIG 127.0 07/27/2019   Lab Results  Component Value Date   CHOLHDL 4 07/27/2019   Lab Results  Component Value Date   HGBA1C 5.3 07/27/2019      Assessment & Plan:   Problem List Items Addressed This Visit      Musculoskeletal and Integument   Bursitis of right shoulder   Relevant Medications   diclofenac Sodium (VOLTAREN) 1 % GEL   Other Relevant Orders   DG Shoulder Right     Other   Right shoulder pain - Primary   Relevant Orders   DG Shoulder Right      Meds ordered this encounter  Medications  . diclofenac Sodium (VOLTAREN) 1 % GEL    Sig: Apply a peanut sized dollop to joint up to 4 times daily as needed.    Dispense:  150 g    Refill:  1    Follow-up: Return if symptoms worsen or fail to improve.   Discussed aftercare to include minimal use of his right shoulder over the next few days.  Described possible inflammatory reaction.  With any increasing redness fever or pain over the next few days, he is to let me know or proceed to the emergency room. [name], MD

## 2019-08-31 ENCOUNTER — Ambulatory Visit (INDEPENDENT_AMBULATORY_CARE_PROVIDER_SITE_OTHER): Payer: Medicare Other

## 2019-08-31 DIAGNOSIS — M7551 Bursitis of right shoulder: Secondary | ICD-10-CM | POA: Diagnosis not present

## 2019-08-31 DIAGNOSIS — M25511 Pain in right shoulder: Secondary | ICD-10-CM

## 2019-08-31 NOTE — Addendum Note (Signed)
Addended by: Lynnea Ferrier on: 08/31/2019 07:34 AM   Modules accepted: Orders

## 2019-08-31 NOTE — Telephone Encounter (Signed)
Patient aware of labs.  

## 2019-09-01 ENCOUNTER — Telehealth: Payer: Self-pay | Admitting: Family Medicine

## 2019-09-01 NOTE — Telephone Encounter (Signed)
Patient is calling and wanted to see if his Xray results were back yet, please advise. CB is [phone]

## 2019-09-02 NOTE — Telephone Encounter (Signed)
Spoke with patient.

## 2019-09-22 DIAGNOSIS — H2513 Age-related nuclear cataract, bilateral: Secondary | ICD-10-CM | POA: Diagnosis not present

## 2019-10-29 ENCOUNTER — Other Ambulatory Visit: Payer: Self-pay | Admitting: Family Medicine

## 2019-10-29 DIAGNOSIS — E7800 Pure hypercholesterolemia, unspecified: Secondary | ICD-10-CM

## 2019-11-29 ENCOUNTER — Other Ambulatory Visit: Payer: Self-pay | Admitting: Family Medicine

## 2019-11-29 DIAGNOSIS — E7800 Pure hypercholesterolemia, unspecified: Secondary | ICD-10-CM

## 2019-12-13 ENCOUNTER — Ambulatory Visit (INDEPENDENT_AMBULATORY_CARE_PROVIDER_SITE_OTHER): Payer: Medicare Other

## 2019-12-13 VITALS — Ht 66.0 in | Wt 168.0 lb

## 2019-12-13 DIAGNOSIS — Z0000 Encounter for general adult medical examination without abnormal findings: Secondary | ICD-10-CM

## 2019-12-13 NOTE — Patient Instructions (Signed)
[name] , Thank you for taking time to complete your Medicare Wellness Visit. I appreciate your ongoing commitment to your health goals. Please review the following plan we discussed and let me know if I can assist you in the future.   Screening recommendations/referrals: Colonoscopy: Last date unknown. Per our conversation today, you will try to find your most recent results and have report sent to Dr. Ethelene Hal. Recommended yearly ophthalmology/optometry visit for glaucoma screening and checkup Recommended yearly dental visit for hygiene and checkup  Vaccinations: Influenza vaccine: Up to date Pneumococcal vaccine: Due- May obtain vaccine at our office or your local pharmacy. Tdap vaccine: Discuss with pharmacy Shingles vaccine: Discuss with pharmacy   Covid-19: Completed vaccines  Advanced directives: Please bring a copy for your chart.  Conditions/risks identified: See problem list  Next appointment: Follow up in one year for your annual wellness visit.   Preventive Care 68 Years and Older, Male Preventive care refers to lifestyle choices and visits with your health care provider that can promote health and wellness. What does preventive care include?  A yearly physical exam. This is also called an annual well check.  Dental exams once or twice a year.  Routine eye exams. Ask your health care provider how often you should have your eyes checked.  Personal lifestyle choices, including:  Daily care of your teeth and gums.  Regular physical activity.  Eating a healthy diet.  Avoiding tobacco and drug use.  Limiting alcohol use.  Practicing safe sex.  Taking low doses of aspirin every day.  Taking vitamin and mineral supplements as recommended by your health care provider. What happens during an annual well check? The services and screenings done by your health care provider during your annual well check will depend on your age, overall health, lifestyle risk factors,  and family history of disease. Counseling  Your health care provider may ask you questions about your:  Alcohol use.  Tobacco use.  Drug use.  Emotional well-being.  Home and relationship well-being.  Sexual activity.  Eating habits.  History of falls.  Memory and ability to understand (cognition).  Work and work Statistician. Screening  You may have the following tests or measurements:  Height, weight, and BMI.  Blood pressure.  Lipid and cholesterol levels. These may be checked every 5 years, or more frequently if you are over 31 years old.  Skin check.  Lung cancer screening. You may have this screening every year starting at age 68 if you have a 30-pack-year history of smoking and currently smoke or have quit within the past 15 years.  Fecal occult blood test (FOBT) of the stool. You may have this test every year starting at age 68.  Flexible sigmoidoscopy or colonoscopy. You may have a sigmoidoscopy every 5 years or a colonoscopy every 10 years starting at age 68.  Prostate cancer screening. Recommendations will vary depending on your family history and other risks.  Hepatitis C blood test.  Hepatitis B blood test.  Sexually transmitted disease (STD) testing.  Diabetes screening. This is done by checking your blood sugar (glucose) after you have not eaten for a while (fasting). You may have this done every 1-3 years.  Abdominal aortic aneurysm (AAA) screening. You may need this if you are a current or former smoker.  Osteoporosis. You may be screened starting at age 68 if you are at high risk. Talk with your health care provider about your test results, treatment options, and if necessary, the need for  more tests. Vaccines  Your health care provider may recommend certain vaccines, such as:  Influenza vaccine. This is recommended every year.  Tetanus, diphtheria, and acellular pertussis (Tdap, Td) vaccine. You may need a Td booster every 10  years.  Zoster vaccine. You may need this after age 68.  Pneumococcal 13-valent conjugate (PCV13) vaccine. One dose is recommended after age 1.  Pneumococcal polysaccharide (PPSV23) vaccine. One dose is recommended after age 11. Talk to your health care provider about which screenings and vaccines you need and how often you need them. This information is not intended to replace advice given to you by your health care provider. Make sure you discuss any questions you have with your health care provider. Document Released: 02/09/2015 Document Revised: 10/03/2015 Document Reviewed: 11/14/2014 Elsevier Interactive Patient Education  2017 [employer] Prevention in the Home Falls can cause injuries. They can happen to people of all ages. There are many things you can do to make your home safe and to help prevent falls. What can I do on the outside of my home?  Regularly fix the edges of walkways and driveways and fix any cracks.  Remove anything that might make you trip as you walk through a door, such as a raised step or threshold.  Trim any bushes or trees on the path to your home.  Use bright outdoor lighting.  Clear any walking paths of anything that might make someone trip, such as rocks or tools.  Regularly check to see if handrails are loose or broken. Make sure that both sides of any steps have handrails.  Any raised decks and porches should have guardrails on the edges.  Have any leaves, snow, or ice cleared regularly.  Use sand or salt on walking paths during winter.  Clean up any spills in your garage right away. This includes oil or grease spills. What can I do in the bathroom?  Use night lights.  Install grab bars by the toilet and in the tub and shower. Do not use towel bars as grab bars.  Use non-skid mats or decals in the tub or shower.  If you need to sit down in the shower, use a plastic, non-slip stool.  Keep the floor dry. Clean up any water that  spills on the floor as soon as it happens.  Remove soap buildup in the tub or shower regularly.  Attach bath mats securely with double-sided non-slip rug tape.  Do not have throw rugs and other things on the floor that can make you trip. What can I do in the bedroom?  Use night lights.  Make sure that you have a light by your bed that is easy to reach.  Do not use any sheets or blankets that are too big for your bed. They should not hang down onto the floor.  Have a firm chair that has side arms. You can use this for support while you get dressed.  Do not have throw rugs and other things on the floor that can make you trip. What can I do in the kitchen?  Clean up any spills right away.  Avoid walking on wet floors.  Keep items that you use a lot in easy-to-reach places.  If you need to reach something above you, use a strong step stool that has a grab bar.  Keep electrical cords out of the way.  Do not use floor polish or wax that makes floors slippery. If you must use wax, use non-skid  floor wax.  Do not have throw rugs and other things on the floor that can make you trip. What can I do with my stairs?  Do not leave any items on the stairs.  Make sure that there are handrails on both sides of the stairs and use them. Fix handrails that are broken or loose. Make sure that handrails are as long as the stairways.  Check any carpeting to make sure that it is firmly attached to the stairs. Fix any carpet that is loose or worn.  Avoid having throw rugs at the top or bottom of the stairs. If you do have throw rugs, attach them to the floor with carpet tape.  Make sure that you have a light switch at the top of the stairs and the bottom of the stairs. If you do not have them, ask someone to add them for you. What else can I do to help prevent falls?  Wear shoes that:  Do not have high heels.  Have rubber bottoms.  Are comfortable and fit you well.  Are closed at the  toe. Do not wear sandals.  If you use a stepladder:  Make sure that it is fully opened. Do not climb a closed stepladder.  Make sure that both sides of the stepladder are locked into place.  Ask someone to hold it for you, if possible.  Clearly mark and make sure that you can see:  Any grab bars or handrails.  First and last steps.  Where the edge of each step is.  Use tools that help you move around (mobility aids) if they are needed. These include:  Canes.  Walkers.  Scooters.  Crutches.  Turn on the lights when you go into a dark area. Replace any light bulbs as soon as they burn out.  Set up your furniture so you have a clear path. Avoid moving your furniture around.  If any of your floors are uneven, fix them.  If there are any pets around you, be aware of where they are.  Review your medicines with your doctor. Some medicines can make you feel dizzy. This can increase your chance of falling. Ask your doctor what other things that you can do to help prevent falls. This information is not intended to replace advice given to you by your health care provider. Make sure you discuss any questions you have with your health care provider. Document Released: 11/09/2008 Document Revised: 06/21/2015 Document Reviewed: 02/17/2014 Elsevier Interactive Patient Education  2017 [employer].

## 2019-12-13 NOTE — Progress Notes (Signed)
Subjective:   Robert Meyers is a 69 y.o. male who presents for an Initial Medicare Annual Wellness Visit.  I connected with Mirko today by telephone and verified that I am speaking with the correct person using two identifiers. Location patient: home Location provider: work Persons participating in the virtual visit: patient, Marine scientist.    I discussed the limitations, risks, security and privacy concerns of performing an evaluation and management service by telephone and the availability of in person appointments. I also discussed with the patient that there may be a patient responsible charge related to this service. The patient expressed understanding and verbally consented to this telephonic visit.    Interactive audio and video telecommunications were attempted between this provider and patient, however failed, due to patient having technical difficulties OR patient did not have access to video capability.  We continued and completed visit with audio only.  Some vital signs may be absent or patient reported.   Time Spent with patient on telephone encounter: 30 minutes  Review of Systems     Cardiac Risk Factors include: advanced age (>56men, >85 women);dyslipidemia;male gender     Objective:    Today's Vitals   12/13/19 0946  Weight: 168 lb (76.2 kg)  Height: 5\' 6"  (1.676 m)   Body mass index is 27.12 kg/m.  Advanced Directives 12/13/2019  Does Patient Have a Medical Advance Directive? Yes  Type of Paramedic of [place];Living will  Copy of [place] in Chart? No - copy requested    Current Medications (verified) Outpatient Encounter Medications as of 12/13/2019  Medication Sig  . aspirin EC 81 MG tablet Take 81 mg by mouth daily.  Marland Kitchen atorvastatin (LIPITOR) 20 MG tablet TAKE 1 TABLET BY MOUTH EVERY DAY  . diclofenac Sodium (VOLTAREN) 1 % GEL Apply a peanut sized dollop to joint up to 4 times daily as needed.  Marland Kitchen omeprazole  (PRILOSEC) 20 MG capsule Take 20 mg by mouth daily.  Marland Kitchen doxycycline (VIBRA-TABS) 100 MG tablet Take 1 tablet (100 mg total) by mouth 2 (two) times daily. (Patient not taking: Reported on 08/30/2019)  . tamsulosin (FLOMAX) 0.4 MG CAPS capsule Take 1 capsule (0.4 mg total) by mouth daily. (Patient not taking: Reported on 12/10/2018)   No facility-administered encounter medications on file as of 12/13/2019.    Allergies (verified) Levofloxacin   History: Past Medical History:  Diagnosis Date  . Allergy   . Cancer ([place])   . GERD (gastroesophageal reflux disease)    Past Surgical History:  Procedure Laterality Date  . bilateral inguinal hernia repair    . deep axillary lymphnode biopsy    . excision left radial head    . excision of spermatocele with epididyectomy    . melanoma wide excision surgery right upper back (stage 2)     Family History  Problem Relation Age of Onset  . Hyperlipidemia Mother   . Cancer Father    Social History   Socioeconomic History  . Marital status: Married    Spouse name: Not on file  . Number of children: Not on file  . Years of education: Not on file  . Highest education level: Not on file  Occupational History  . Not on file  Tobacco Use  . Smoking status: Never Smoker  . Smokeless tobacco: Never Used  Substance and Sexual Activity  . Alcohol use: Yes    Comment: occasionally  . Drug use: Not on file  . Sexual activity: Not  on file  Other Topics Concern  . Not on file  Social History Narrative  . Not on file   Social Determinants of Health   Financial Resource Strain: Low Risk   . Difficulty of Paying Living Expenses: Not hard at all  Food Insecurity: No Food Insecurity  . Worried About Charity fundraiser in the Last Year: Never true  . Ran Out of Food in the Last Year: Never true  Transportation Needs: No Transportation Needs  . Lack of Transportation (Medical): No  . Lack of Transportation (Non-Medical): No  Physical Activity:  Sufficiently Active  . Days of Exercise per Week: 7 days  . Minutes of Exercise per Session: 30 min  Stress: No Stress Concern Present  . Feeling of Stress : Only a little  Social Connections: Moderately Isolated  . Frequency of Communication with Friends and Family: More than three times a week  . Frequency of Social Gatherings with Friends and Family: Once a week  . Attends Religious Services: Never  . Active Member of Clubs or Organizations: No  . Attends Archivist Meetings: Never  . Marital Status: Married    Tobacco Counseling Counseling given: Not Answered   Clinical Intake:  Pre-visit preparation completed: Yes  Pain : No/denies pain     Nutritional Status: BMI 25 -29 Overweight Nutritional Risks: None Diabetes: No  How often do you need to have someone help you when you read instructions, pamphlets, or other written materials from your doctor or pharmacy?: 1 - Never What is the last grade level you completed in school?: 12th grade  Diabetic?No  Interpreter Needed?: No  Information entered by :: Caroleen Hamman LPN   Activities of Daily Living In your present state of health, do you have any difficulty performing the following activities: 12/13/2019  Hearing? N  Vision? N  Difficulty concentrating or making decisions? N  Walking or climbing stairs? N  Dressing or bathing? N  Doing errands, shopping? N  Preparing Food and eating ? N  Using the Toilet? N  In the past six months, have you accidently leaked urine? N  Do you have problems with loss of bowel control? N  Managing your Medications? N  Managing your Finances? N  Housekeeping or managing your Housekeeping? N  Some recent data might be hidden    Patient Care Team: [name], MD as PCP - General (Family Medicine)  Indicate any recent Medical Services you may have received from other than [facility] providers in the past year (date may be approximate).     Assessment:   This  is a routine wellness examination for Robert Meyers.  Hearing/Vision screen  Hearing Screening   125Hz  250Hz  500Hz  1000Hz  2000Hz  3000Hz  4000Hz  6000Hz  8000Hz   Right ear:           Left ear:           Comments: No issues  Vision Screening Comments: Last eye exam-09/2019  Dietary issues and exercise activities discussed: Current Exercise Habits: Home exercise routine, Type of exercise: walking, Time (Minutes): 30, Frequency (Times/Week): 7, Weekly Exercise (Minutes/Week): 210, Intensity: Mild, Exercise limited by: None identified  Goals    . Patient Stated     None      Depression Screen PHQ 2/9 Scores 12/13/2019 07/27/2019  PHQ - 2 Score 0 2  PHQ- 9 Score - 4    Fall Risk Fall Risk  12/13/2019  Falls in the past year? 0  Number falls in past yr:  0  Injury with Fall? 0  Follow up Falls prevention discussed    Any stairs in or around the home? Yes  If so, are there any without handrails? No  Home free of loose throw rugs in walkways, pet beds, electrical cords, etc? Yes  Adequate lighting in your home to reduce risk of falls? Yes   ASSISTIVE DEVICES UTILIZED TO PREVENT FALLS:  Life alert? No  Use of a cane, walker or w/c? No  Grab bars in the bathroom? No  Shower chair or bench in shower? No  Elevated toilet seat or a handicapped toilet? No   TIMED UP AND GO:  Was the test performed? No . Phone visit   Cognitive Function:No cognitive impairment noted        Immunizations Immunization History  Administered Date(s) Administered  . Influenza, High Dose Seasonal PF 11/18/2019  . PFIZER SARS-COV-2 Vaccination 11/18/2019  . Zoster 09/14/2012    TDAP status: Due, Education has been provided regarding the importance of this vaccine. Advised may receive this vaccine at local pharmacy or Health Dept. Aware to provide a copy of the vaccination record if obtained from local pharmacy or Health Dept. Verbalized acceptance and understanding.   Flu Vaccine status: Up to date    Pneumococcal vaccine status: Due- Patient to discuss with PCP at next office visit.  Covid-19 vaccine status: Completed vaccines  Qualifies for Shingles Vaccine? Yes   Zostavax completed Yes   Shingrix Completed?: No.    Education has been provided regarding the importance of this vaccine. Patient has been advised to call insurance company to determine out of pocket expense if they have not yet received this vaccine. Advised may also receive vaccine at local pharmacy or Health Dept. Verbalized acceptance and understanding.  Screening Tests Health Maintenance  Topic Date Due  . Hepatitis C Screening  Never done  . TETANUS/TDAP  Never done  . COLONOSCOPY  Never done  . PNA vac Low Risk Adult (1 of 2 - PCV13) Never done  . COVID-19 Vaccine (2 - Pfizer 2-dose series) 12/09/2019  . INFLUENZA VACCINE  Completed    Health Maintenance  Health Maintenance Due  Topic Date Due  . Hepatitis C Screening  Never done  . TETANUS/TDAP  Never done  . COLONOSCOPY  Never done  . PNA vac Low Risk Adult (1 of 2 - PCV13) Never done  . COVID-19 Vaccine (2 - Pfizer 2-dose series) 12/09/2019   Colorectal Cancer screening: Patient states he has had within the past 10 years. He plans to locate report and send to Dr. Ethelene Hal.  Lung Cancer Screening: (Low Dose CT Chest recommended if Age 61-80 years, 30 pack-year currently smoking OR have quit w/in 15years.) does not qualify.     Additional Screening:  Hepatitis C Screening: does qualify; Discuss with PCP  Vision Screening: Recommended annual ophthalmology exams for early detection of glaucoma and other disorders of the eye. Is the patient up to date with their annual eye exam?  Yes  Who is the provider or what is the name of the office in which the patient attends annual eye exams? Unsure of name   Dental Screening: Recommended annual dental exams for proper oral hygiene  Community Resource Referral / Chronic Care Management: CRR required this  visit?  No   CCM required this visit?  No      Plan:     I have personally reviewed and noted the following in the patient's chart:   . Medical and  social history . Use of alcohol, tobacco or illicit drugs  . Current medications and supplements . Functional ability and status . Nutritional status . Physical activity . Advanced directives . List of other physicians . Hospitalizations, surgeries, and ER visits in previous 12 months . Vitals . Screenings to include cognitive, depression, and falls . Referrals and appointments  In addition, I have reviewed and discussed with patient certain preventive protocols, quality metrics, and best practice recommendations. A written personalized care plan for preventive services as well as general preventive health recommendations were provided to patient.   Due to this being a telephonic visit, the after visit summary with patients personalized plan was offered to patient via mail or my-chart.  per request, patient was mailed a copy of Teller, LPN   04/17/2246  Nurse Health Advisor  Nurse Notes: None

## 2019-12-26 DIAGNOSIS — D239 Other benign neoplasm of skin, unspecified: Secondary | ICD-10-CM | POA: Diagnosis not present

## 2019-12-26 DIAGNOSIS — Z85828 Personal history of other malignant neoplasm of skin: Secondary | ICD-10-CM | POA: Diagnosis not present

## 2019-12-26 DIAGNOSIS — Z85820 Personal history of malignant melanoma of skin: Secondary | ICD-10-CM | POA: Diagnosis not present

## 2019-12-26 DIAGNOSIS — L578 Other skin changes due to chronic exposure to nonionizing radiation: Secondary | ICD-10-CM | POA: Diagnosis not present

## 2019-12-26 DIAGNOSIS — D1801 Hemangioma of skin and subcutaneous tissue: Secondary | ICD-10-CM | POA: Diagnosis not present

## 2020-01-30 ENCOUNTER — Ambulatory Visit: Payer: Medicare Other | Admitting: Family Medicine

## 2020-03-04 ENCOUNTER — Other Ambulatory Visit: Payer: Self-pay | Admitting: Family Medicine

## 2020-03-04 DIAGNOSIS — E7800 Pure hypercholesterolemia, unspecified: Secondary | ICD-10-CM

## 2020-07-03 ENCOUNTER — Ambulatory Visit: Payer: Medicare Other | Admitting: Family Medicine

## 2020-07-27 ENCOUNTER — Encounter: Payer: Self-pay | Admitting: Family Medicine

## 2020-07-27 ENCOUNTER — Ambulatory Visit (INDEPENDENT_AMBULATORY_CARE_PROVIDER_SITE_OTHER): Payer: Medicare Other | Admitting: Family Medicine

## 2020-07-27 ENCOUNTER — Other Ambulatory Visit: Payer: Self-pay

## 2020-07-27 VITALS — BP 114/62 | HR 71 | Temp 98.1°F | Ht 66.0 in | Wt 169.2 lb

## 2020-07-27 DIAGNOSIS — Z23 Encounter for immunization: Secondary | ICD-10-CM | POA: Diagnosis not present

## 2020-07-27 DIAGNOSIS — G629 Polyneuropathy, unspecified: Secondary | ICD-10-CM | POA: Diagnosis not present

## 2020-07-27 DIAGNOSIS — Z0000 Encounter for general adult medical examination without abnormal findings: Secondary | ICD-10-CM | POA: Diagnosis not present

## 2020-07-27 DIAGNOSIS — R7989 Other specified abnormal findings of blood chemistry: Secondary | ICD-10-CM

## 2020-07-27 DIAGNOSIS — E538 Deficiency of other specified B group vitamins: Secondary | ICD-10-CM

## 2020-07-27 DIAGNOSIS — R3912 Poor urinary stream: Secondary | ICD-10-CM

## 2020-07-27 DIAGNOSIS — N401 Enlarged prostate with lower urinary tract symptoms: Secondary | ICD-10-CM

## 2020-07-27 LAB — LIPID PANEL
Cholesterol: 136 mg/dL (ref 0–200)
HDL: 48.5 mg/dL (ref >39.00)
LDL Cholesterol: 62 mg/dL (ref 0–99)
NonHDL: 87.19
Total CHOL/HDL Ratio: 3
Triglycerides: 125 mg/dL (ref 0.0–149.0)
VLDL: 25 mg/dL (ref 0.0–40.0)

## 2020-07-27 LAB — CBC
HCT: 44.1 % (ref 39.0–52.0)
Hemoglobin: 15.1 g/dL (ref 13.0–17.0)
MCHC: 34.3 g/dL (ref 30.0–36.0)
MCV: 92.1 fl (ref 78.0–100.0)
Platelets: 216 10*3/uL (ref 150.0–400.0)
RBC: 4.79 Mil/uL (ref 4.22–5.81)
RDW: 12.8 % (ref 11.5–15.5)
WBC: 7.4 10*3/uL (ref 4.0–10.5)

## 2020-07-27 LAB — TSH: TSH: 3.81 u[IU]/mL (ref 0.35–5.50)

## 2020-07-27 LAB — COMPREHENSIVE METABOLIC PANEL
ALT: 34 U/L (ref 0–53)
AST: 26 U/L (ref 0–37)
Albumin: 4.8 g/dL (ref 3.5–5.2)
Alkaline Phosphatase: 49 U/L (ref 39–117)
BUN: 18 mg/dL (ref 6–23)
CO2: 29 mEq/L (ref 19–32)
Calcium: 9.5 mg/dL (ref 8.4–10.5)
Chloride: 103 mEq/L (ref 96–112)
Creatinine, Ser: 1.02 mg/dL (ref 0.40–1.50)
GFR: 75.43 mL/min (ref >60.00)
Glucose, Bld: 95 mg/dL (ref 70–99)
Potassium: 3.9 mEq/L (ref 3.5–5.1)
Sodium: 139 mEq/L (ref 135–145)
Total Bilirubin: 0.7 mg/dL (ref 0.2–1.2)
Total Protein: 6.6 g/dL (ref 6.0–8.3)

## 2020-07-27 LAB — URINALYSIS, ROUTINE W REFLEX MICROSCOPIC
Bilirubin Urine: NEGATIVE
Hgb urine dipstick: NEGATIVE
Ketones, ur: NEGATIVE
Leukocytes,Ua: NEGATIVE
Nitrite: NEGATIVE
Specific Gravity, Urine: 1.015 (ref 1.000–1.030)
Total Protein, Urine: NEGATIVE
Urine Glucose: NEGATIVE
Urobilinogen, UA: 0.2 (ref 0.0–1.0)
pH: 6.5 (ref 5.0–8.0)

## 2020-07-27 LAB — VITAMIN B12: Vitamin B-12: 1040 pg/mL — ABNORMAL HIGH (ref 211–911)

## 2020-07-27 LAB — PSA: PSA: 1.23 ng/mL (ref 0.10–4.00)

## 2020-07-27 MED ORDER — GABAPENTIN 300 MG PO CAPS
300.0000 mg | ORAL_CAPSULE | Freq: Every day | ORAL | 1 refills | Status: DC
Start: 2020-07-27 — End: 2022-05-19

## 2020-07-27 NOTE — Progress Notes (Signed)
Established Patient Office Visit  Subjective:  Patient ID: Robert Meyers, male    DOB: 11-01-1950  Age: 70 y.o. MRN: [id number]  CC:  Chief Complaint  Patient presents with   Annual Exam    Patient fasting today    HPI Robert Meyers presents for for physical exam and follow-up of his low B12 and elevated TSH levels.  He does have a history of neuropathy involving both of his feet.  It does bother him mostly in the evening.  He has seen multiple doctors for this and until now has not been interested in treatment.  He has been taking a multivitamin with B12 in it and this is not helped.  TSH levels have been high normal.  Polyps were seen in his last colonoscopy in 2017.  He is planning follow-up with the GI group for repeat colonoscopy.  His father passed from colon cancer.  Decided not to take the tamsulosin.  He is experiencing nocturia x1 or 2 but does consume copious fluids right before bed.  Past Medical History:  Diagnosis Date   Allergy    Cancer ([place])    GERD (gastroesophageal reflux disease)     Past Surgical History:  Procedure Laterality Date   bilateral inguinal hernia repair     deep axillary lymphnode biopsy     excision left radial head     excision of spermatocele with epididyectomy     melanoma wide excision surgery right upper back (stage 2)      Family History  Problem Relation Age of Onset   Hyperlipidemia Mother    Alzheimer's disease Mother    Cancer Father    Colon cancer Father    Alzheimer's disease Sister     Social History   Socioeconomic History   Marital status: Married    Spouse name: Not on file   Number of children: Not on file   Years of education: Not on file   Highest education level: Not on file  Occupational History   Not on file  Tobacco Use   Smoking status: Never   Smokeless tobacco: Never  Vaping Use   Vaping Use: Never used  Substance and Sexual Activity   Alcohol use: Yes    Comment: occasionally   Drug use: Never    Sexual activity: Not on file  Other Topics Concern   Not on file  Social History Narrative   Not on file   Social Determinants of Health   Financial Resource Strain: Low Risk    Difficulty of Paying Living Expenses: Not hard at all  Food Insecurity: No Food Insecurity   Worried About Charity fundraiser in the Last Year: Never true   Ran Out of Food in the Last Year: Never true  Transportation Needs: No Transportation Needs   Lack of Transportation (Medical): No   Lack of Transportation (Non-Medical): No  Physical Activity: Sufficiently Active   Days of Exercise per Week: 7 days   Minutes of Exercise per Session: 30 min  Stress: No Stress Concern Present   Feeling of Stress : Only a little  Social Connections: Moderately Isolated   Frequency of Communication with Friends and Family: More than three times a week   Frequency of Social Gatherings with Friends and Family: Once a week   Attends Religious Services: Never   Marine scientist or Organizations: No   Attends Archivist Meetings: Never   Marital Status: Married  Human resources officer Violence: Not At  Risk   Fear of Current or Ex-Partner: No   Emotionally Abused: No   Physically Abused: No   Sexually Abused: No    Outpatient Medications Prior to Visit  Medication Sig Dispense Refill   aspirin EC 81 MG tablet Take 81 mg by mouth daily.     atorvastatin (LIPITOR) 20 MG tablet TAKE 1 TABLET BY MOUTH EVERY DAY 90 tablet 1   diclofenac Sodium (VOLTAREN) 1 % GEL Apply a peanut sized dollop to joint up to 4 times daily as needed. 150 g 1   omeprazole (PRILOSEC) 20 MG capsule Take 20 mg by mouth daily.     tamsulosin (FLOMAX) 0.4 MG CAPS capsule Take 1 capsule (0.4 mg total) by mouth daily. 30 capsule 3   doxycycline (VIBRA-TABS) 100 MG tablet Take 1 tablet (100 mg total) by mouth 2 (two) times daily. (Patient not taking: Reported on 08/30/2019) 20 tablet 0   No facility-administered medications prior to visit.     Allergies  Allergen Reactions   Levofloxacin Hives and Rash    Hives Hives Hives     ROS Review of Systems  Constitutional: Negative.   HENT: Negative.    Eyes:  Negative for photophobia and visual disturbance.  Respiratory: Negative.    Cardiovascular: Negative.   Gastrointestinal: Negative.  Negative for anal bleeding, blood in stool and constipation.  Endocrine: Negative for polyphagia and polyuria.  Genitourinary:  Negative for difficulty urinating, frequency and urgency.  Musculoskeletal:  Negative for gait problem and joint swelling.  Neurological:  Negative for weakness and numbness.  Psychiatric/Behavioral: Negative.       Objective:    Physical Exam Vitals and nursing note reviewed.  Constitutional:      General: He is not in acute distress.    Appearance: Normal appearance. He is normal weight. He is not ill-appearing, toxic-appearing or diaphoretic.  HENT:     Head: Normocephalic and atraumatic.     Right Ear: Tympanic membrane, ear canal and external ear normal.     Left Ear: Tympanic membrane, ear canal and external ear normal.     Mouth/Throat:     Mouth: Mucous membranes are moist.     Pharynx: Oropharynx is clear. No oropharyngeal exudate or posterior oropharyngeal erythema.  Eyes:     General: No scleral icterus.       Right eye: No discharge.        Left eye: No discharge.     Extraocular Movements: Extraocular movements intact.     Conjunctiva/sclera: Conjunctivae normal.     Pupils: Pupils are equal, round, and reactive to light.  Neck:     Vascular: No carotid bruit.  Cardiovascular:     Rate and Rhythm: Normal rate and regular rhythm.  Pulmonary:     Effort: Pulmonary effort is normal.     Breath sounds: Normal breath sounds.  Abdominal:     General: Bowel sounds are normal. There is no distension.     Palpations: There is no mass.     Tenderness: There is no abdominal tenderness. There is no guarding or rebound.     Hernia: No  hernia is present.  Genitourinary:    Prostate: Not enlarged, not tender and no nodules present.     Rectum: Guaiac result negative. No mass, tenderness, anal fissure, external hemorrhoid or internal hemorrhoid. Normal anal tone.  Musculoskeletal:     Cervical back: Normal range of motion and neck supple. No rigidity or tenderness.  Lymphadenopathy:  Cervical: No cervical adenopathy.  Skin:    General: Skin is warm and dry.  Neurological:     Mental Status: He is alert and oriented to person, place, and time.  Psychiatric:        Mood and Affect: Mood normal.        Behavior: Behavior normal.    BP 114/62   Pulse 71   Temp 98.1 F (36.7 C)   Ht 5\' 6"  (1.676 m)   Wt 169 lb 3.2 oz (76.7 kg)   SpO2 99%   BMI 27.31 kg/m  Wt Readings from Last 3 Encounters:  07/27/20 169 lb 3.2 oz (76.7 kg)  12/13/19 168 lb (76.2 kg)  08/30/19 179 lb 3.2 oz (81.3 kg)     Health Maintenance Due  Topic Date Due   Hepatitis C Screening  Never done   TETANUS/TDAP  Never done   Zoster Vaccines- Shingrix (1 of 2) Never done   PNA vac Low Risk Adult (1 of 2 - PCV13) Never done   COVID-19 Vaccine (4 - Booster for Pfizer series) 02/18/2020    There are no preventive care reminders to display for this patient.  Lab Results  Component Value Date   TSH 4.03 07/27/2019   Lab Results  Component Value Date   WBC 6.7 07/27/2019   HGB 15.2 07/27/2019   HCT 43.8 07/27/2019   MCV 92.8 07/27/2019   PLT 208.0 07/27/2019   Lab Results  Component Value Date   NA 139 07/27/2019   K 3.9 07/27/2019   CO2 29 07/27/2019   GLUCOSE 92 07/27/2019   BUN 19 07/27/2019   CREATININE 1.01 07/27/2019   BILITOT 0.9 07/27/2019   ALKPHOS 48 07/27/2019   AST 24 07/27/2019   ALT 30 07/27/2019   PROT 6.6 07/27/2019   ALBUMIN 4.8 07/27/2019   CALCIUM 9.4 07/27/2019   GFR 73.53 07/27/2019   Lab Results  Component Value Date   CHOL 157 07/27/2019   Lab Results  Component Value Date   HDL 44.80  07/27/2019   Lab Results  Component Value Date   LDLCALC 87 07/27/2019   Lab Results  Component Value Date   TRIG 127.0 07/27/2019   Lab Results  Component Value Date   CHOLHDL 4 07/27/2019   Lab Results  Component Value Date   HGBA1C 5.3 07/27/2019      Assessment & Plan:   Problem List Items Addressed This Visit       Other   Healthcare maintenance   Relevant Orders   CBC   Comprehensive metabolic panel   Lipid panel   Urinalysis, Routine w reflex microscopic   Benign prostatic hyperplasia with weak urinary stream   Relevant Orders   PSA   Other Visit Diagnoses     Need for vaccine for DT (diphtheria-tetanus)    -  Primary   Relevant Orders   Td vaccine greater than or equal to 7yo preservative free IM   Elevated TSH       Relevant Orders   TSH   Low serum vitamin B12       Relevant Orders   Vitamin B12   CBC   Neuropathy       Relevant Medications   gabapentin (NEURONTIN) 300 MG capsule       Meds ordered this encounter  Medications   gabapentin (NEURONTIN) 300 MG capsule    Sig: Take 1 capsule (300 mg total) by mouth at bedtime.    Dispense:  90  capsule    Refill:  1     Follow-up: Return in about 6 months (around 01/27/2021), or if symptoms worsen or fail to improve.   Given information on health maintenance disease prevention and neuropathy.  Agrees to try Neurontin nightly.  Advised restricting fluid intake 2 hours prior to bed.  He will follow-up with [facility] for repeat colonoscopy.  Rechecking B12 and TSH.  These could be connected to his neuropathy.  We discussed that [name], MD

## 2020-09-14 ENCOUNTER — Other Ambulatory Visit: Payer: Self-pay | Admitting: Family

## 2020-09-14 DIAGNOSIS — E7800 Pure hypercholesterolemia, unspecified: Secondary | ICD-10-CM

## 2020-09-23 IMAGING — DX DG SHOULDER 2+V*R*
3 series · 3 of 3 positions shown · non-contrast
Comparison: None.

CLINICAL DATA: Chronic right shoulder pain without known injury.

EXAM:
RIGHT SHOULDER - 2+ VIEW

[shoulder (grashey) ap]
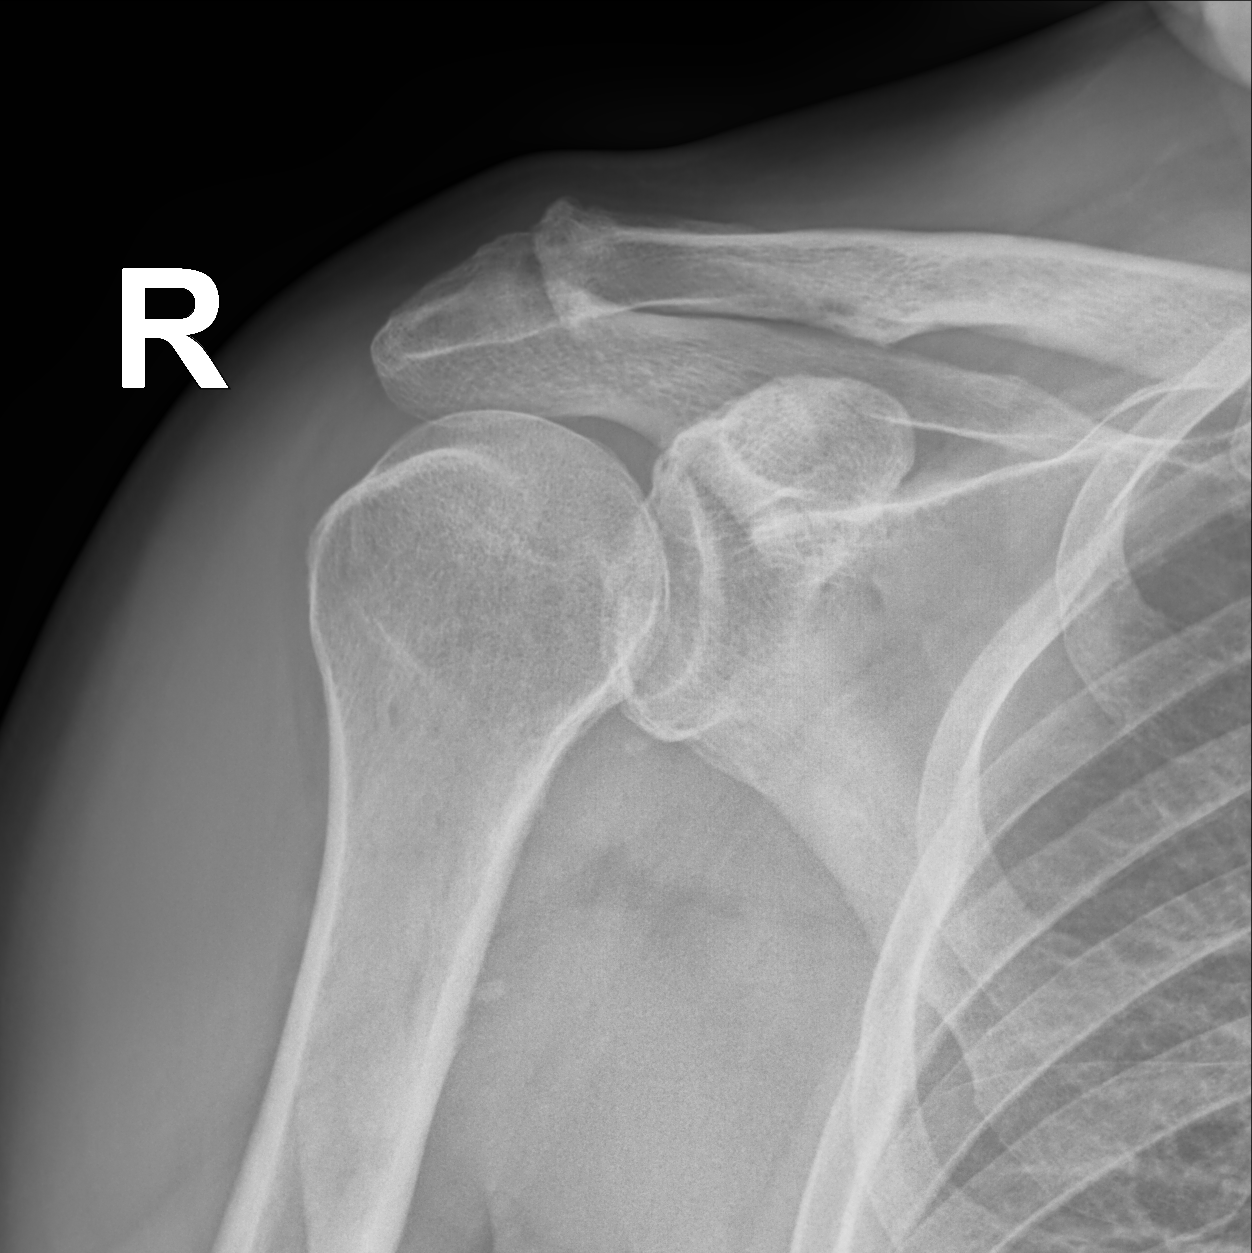

[shoulder axial]
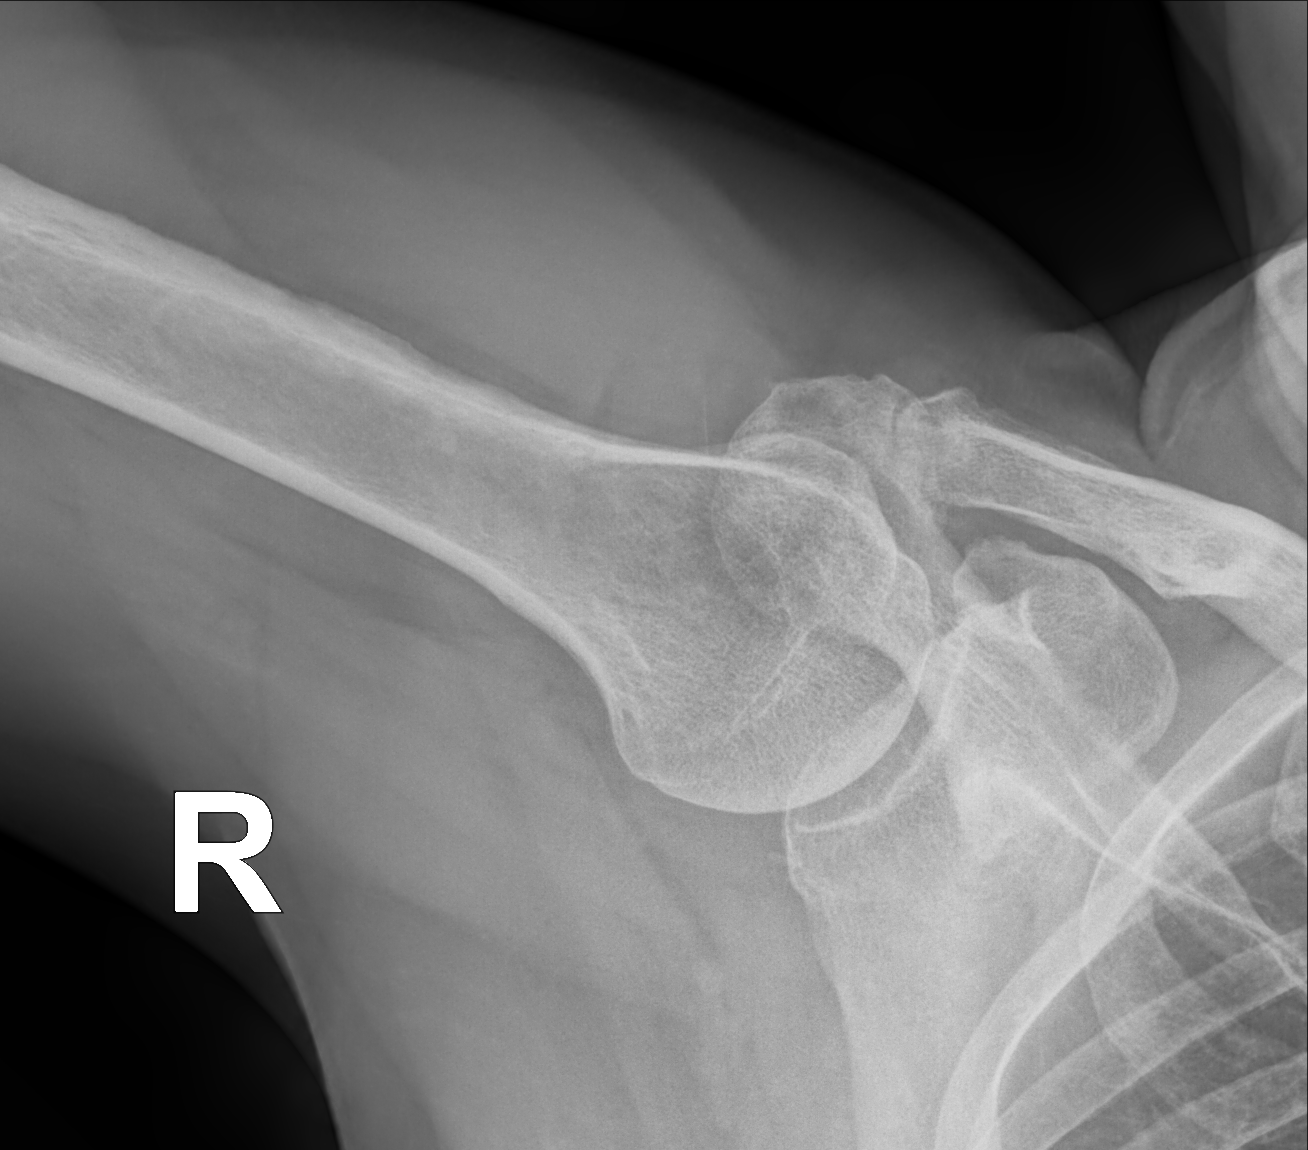

[shoulder y view]
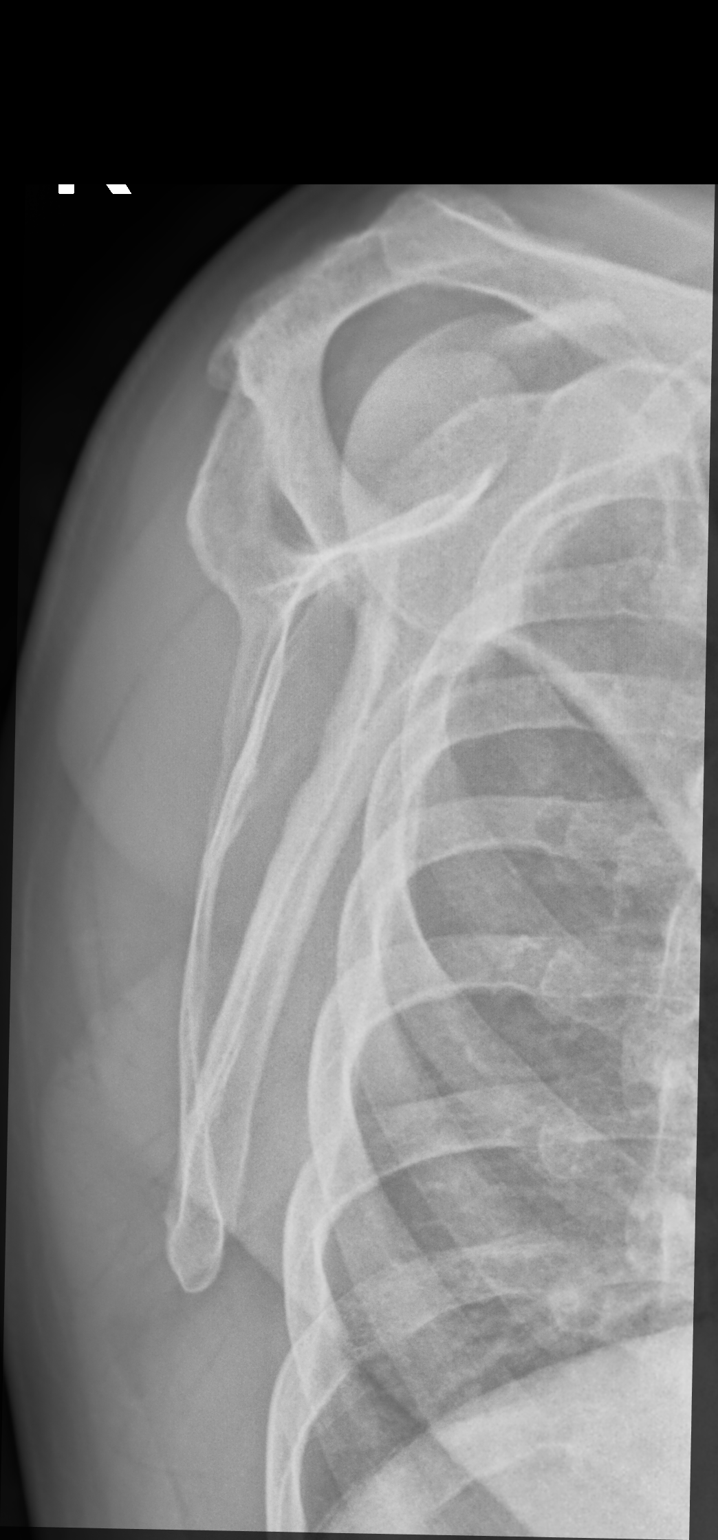

[3 of 3 positions shown; findings below may reference images not displayed]

FINDINGS: There is no evidence of fracture or dislocation. Moderate
degenerative changes seen involving the right acromioclavicular
joint. Soft tissues are unremarkable.
IMPRESSION: Moderate degenerative joint disease of the right acromioclavicular
joint. No acute abnormality seen.

## 2020-09-27 NOTE — Telephone Encounter (Signed)
Pt requesting refill cholesterol Rx.

## 2020-12-24 DIAGNOSIS — L608 Other nail disorders: Secondary | ICD-10-CM | POA: Diagnosis not present

## 2020-12-24 DIAGNOSIS — D1801 Hemangioma of skin and subcutaneous tissue: Secondary | ICD-10-CM | POA: Diagnosis not present

## 2020-12-24 DIAGNOSIS — L814 Other melanin hyperpigmentation: Secondary | ICD-10-CM | POA: Diagnosis not present

## 2020-12-24 DIAGNOSIS — Z85828 Personal history of other malignant neoplasm of skin: Secondary | ICD-10-CM | POA: Diagnosis not present

## 2020-12-24 DIAGNOSIS — L821 Other seborrheic keratosis: Secondary | ICD-10-CM | POA: Diagnosis not present

## 2020-12-24 DIAGNOSIS — Z85820 Personal history of malignant melanoma of skin: Secondary | ICD-10-CM | POA: Diagnosis not present

## 2020-12-24 DIAGNOSIS — I781 Nevus, non-neoplastic: Secondary | ICD-10-CM | POA: Diagnosis not present

## 2020-12-24 DIAGNOSIS — L578 Other skin changes due to chronic exposure to nonionizing radiation: Secondary | ICD-10-CM | POA: Diagnosis not present

## 2020-12-24 DIAGNOSIS — L820 Inflamed seborrheic keratosis: Secondary | ICD-10-CM | POA: Diagnosis not present

## 2020-12-24 DIAGNOSIS — L910 Hypertrophic scar: Secondary | ICD-10-CM | POA: Diagnosis not present

## 2021-01-30 ENCOUNTER — Encounter: Payer: Self-pay | Admitting: Family Medicine

## 2021-01-30 ENCOUNTER — Other Ambulatory Visit: Payer: Self-pay

## 2021-01-30 ENCOUNTER — Ambulatory Visit (INDEPENDENT_AMBULATORY_CARE_PROVIDER_SITE_OTHER): Payer: Medicare Other | Admitting: Family Medicine

## 2021-01-30 VITALS — BP 136/74 | HR 70 | Temp 97.3°F | Ht 66.0 in | Wt 177.0 lb

## 2021-01-30 DIAGNOSIS — J0100 Acute maxillary sinusitis, unspecified: Secondary | ICD-10-CM | POA: Diagnosis not present

## 2021-01-30 DIAGNOSIS — N401 Enlarged prostate with lower urinary tract symptoms: Secondary | ICD-10-CM

## 2021-01-30 DIAGNOSIS — E7800 Pure hypercholesterolemia, unspecified: Secondary | ICD-10-CM

## 2021-01-30 DIAGNOSIS — Z0000 Encounter for general adult medical examination without abnormal findings: Secondary | ICD-10-CM | POA: Diagnosis not present

## 2021-01-30 DIAGNOSIS — R7309 Other abnormal glucose: Secondary | ICD-10-CM | POA: Diagnosis not present

## 2021-01-30 DIAGNOSIS — R3912 Poor urinary stream: Secondary | ICD-10-CM | POA: Diagnosis not present

## 2021-01-30 LAB — PSA: PSA: 1.75 ng/mL (ref 0.10–4.00)

## 2021-01-30 LAB — URINALYSIS, ROUTINE W REFLEX MICROSCOPIC
Bilirubin Urine: NEGATIVE
Hgb urine dipstick: NEGATIVE
Ketones, ur: NEGATIVE
Leukocytes,Ua: NEGATIVE
Nitrite: NEGATIVE
RBC / HPF: NONE SEEN (ref 0–?)
Specific Gravity, Urine: 1.01 (ref 1.000–1.030)
Total Protein, Urine: NEGATIVE
Urine Glucose: NEGATIVE
Urobilinogen, UA: 0.2 (ref 0.0–1.0)
pH: 7 (ref 5.0–8.0)

## 2021-01-30 LAB — LIPID PANEL
Cholesterol: 152 mg/dL (ref 0–200)
HDL: 41.9 mg/dL (ref >39.00)
LDL Cholesterol: 82 mg/dL (ref 0–99)
NonHDL: 109.77
Total CHOL/HDL Ratio: 4
Triglycerides: 141 mg/dL (ref 0.0–149.0)
VLDL: 28.2 mg/dL (ref 0.0–40.0)

## 2021-01-30 LAB — COMPREHENSIVE METABOLIC PANEL
ALT: 60 U/L — ABNORMAL HIGH (ref 0–53)
AST: 35 U/L (ref 0–37)
Albumin: 4.8 g/dL (ref 3.5–5.2)
Alkaline Phosphatase: 55 U/L (ref 39–117)
BUN: 17 mg/dL (ref 6–23)
CO2: 32 mEq/L (ref 19–32)
Calcium: 9.5 mg/dL (ref 8.4–10.5)
Chloride: 101 mEq/L (ref 96–112)
Creatinine, Ser: 1.03 mg/dL (ref 0.40–1.50)
GFR: 74.28 mL/min (ref >60.00)
Glucose, Bld: 93 mg/dL (ref 70–99)
Potassium: 4.4 mEq/L (ref 3.5–5.1)
Sodium: 139 mEq/L (ref 135–145)
Total Bilirubin: 0.8 mg/dL (ref 0.2–1.2)
Total Protein: 6.9 g/dL (ref 6.0–8.3)

## 2021-01-30 LAB — CBC
HCT: 46.4 % (ref 39.0–52.0)
Hemoglobin: 15.7 g/dL (ref 13.0–17.0)
MCHC: 33.8 g/dL (ref 30.0–36.0)
MCV: 93.4 fl (ref 78.0–100.0)
Platelets: 232 10*3/uL (ref 150.0–400.0)
RBC: 4.97 Mil/uL (ref 4.22–5.81)
RDW: 12.8 % (ref 11.5–15.5)
WBC: 6.9 10*3/uL (ref 4.0–10.5)

## 2021-01-30 LAB — HEMOGLOBIN A1C: Hgb A1c MFr Bld: 5.6 % (ref 4.6–6.5)

## 2021-01-30 MED ORDER — AMOXICILLIN 875 MG PO TABS: 875.0000 mg | ORAL_TABLET | Freq: Two times a day (BID) | ORAL | 0 refills | Status: AC

## 2021-01-30 NOTE — Progress Notes (Signed)
Established Patient Office Visit  Subjective:  Patient ID: Robert Meyers, male    DOB: July 11, 1950  Age: 71 y.o. MRN: [id number]  CC:  Chief Complaint  Patient presents with   Follow-up    6 month follow up sinus issues, nasal congestion little blood when blowing nose.     HPI Robert Meyers presents for 2-week history nasal congestion postnasal drip bloody purulent rhinorrhea facial pressure and low-grade temperatures.  Denies fever chills cough wheezing or difficulty breathing.  This started after he blew the leaves at his [place] and seems to have been yearly at this time.  Is here fasting for his yearly physical.  On his way to [place] next week with his daughter and grandkids.  He is not looking forward to it and remains concerned about the state of the world.  Past Medical History:  Diagnosis Date   Allergy    Cancer ([place])    GERD (gastroesophageal reflux disease)     Past Surgical History:  Procedure Laterality Date   bilateral inguinal hernia repair     deep axillary lymphnode biopsy     excision left radial head     excision of spermatocele with epididyectomy     melanoma wide excision surgery right upper back (stage 2)      Family History  Problem Relation Age of Onset   Hyperlipidemia Mother    Alzheimer's disease Mother    Cancer Father    Colon cancer Father    Alzheimer's disease Sister     Social History   Socioeconomic History   Marital status: Married    Spouse name: Not on file   Number of children: Not on file   Years of education: Not on file   Highest education level: Not on file  Occupational History   Not on file  Tobacco Use   Smoking status: Never   Smokeless tobacco: Never  Vaping Use   Vaping Use: Never used  Substance and Sexual Activity   Alcohol use: Yes    Comment: occasionally   Drug use: Never   Sexual activity: Not on file  Other Topics Concern   Not on file  Social History Narrative   Not on file   Social  Determinants of Health   Financial Resource Strain: Not on file  Food Insecurity: Not on file  Transportation Needs: Not on file  Physical Activity: Not on file  Stress: Not on file  Social Connections: Not on file  Intimate Partner Violence: Not on file    Outpatient Medications Prior to Visit  Medication Sig Dispense Refill   aspirin EC 81 MG tablet Take 81 mg by mouth daily.     atorvastatin (LIPITOR) 20 MG tablet TAKE 1 TABLET BY MOUTH EVERY DAY 90 tablet 1   omeprazole (PRILOSEC) 20 MG capsule Take 20 mg by mouth daily.     diclofenac Sodium (VOLTAREN) 1 % GEL Apply a peanut sized dollop to joint up to 4 times daily as needed. (Patient not taking: Reported on 01/30/2021) 150 g 1   gabapentin (NEURONTIN) 300 MG capsule Take 1 capsule (300 mg total) by mouth at bedtime. (Patient not taking: Reported on 01/30/2021) 90 capsule 1   No facility-administered medications prior to visit.    Allergies  Allergen Reactions   Levofloxacin Hives and Rash    Hives Hives Hives     ROS Review of Systems  Constitutional:  Negative for diaphoresis, fatigue, fever and unexpected weight change.  HENT:  Positive for congestion, nosebleeds, postnasal drip, rhinorrhea, sinus pressure, sinus pain and sneezing.   Eyes:  Negative for photophobia and visual disturbance.  Respiratory: Negative.  Negative for cough and shortness of breath.   Cardiovascular: Negative.   Gastrointestinal: Negative.   Genitourinary: Negative.   Neurological:  Negative for speech difficulty and weakness.     Objective:    Physical Exam Vitals and nursing note reviewed.  Constitutional:      General: He is not in acute distress.    Appearance: Normal appearance. He is not ill-appearing, toxic-appearing or diaphoretic.  HENT:     Head: Normocephalic and atraumatic.     Right Ear: Tympanic membrane, ear canal and external ear normal.     Left Ear: Tympanic membrane, ear canal and external ear normal.      Mouth/Throat:     Mouth: Mucous membranes are moist.     Pharynx: Oropharynx is clear. No oropharyngeal exudate or posterior oropharyngeal erythema.  Eyes:     General: No scleral icterus.       Right eye: No discharge.        Left eye: No discharge.     Extraocular Movements: Extraocular movements intact.     Conjunctiva/sclera: Conjunctivae normal.     Pupils: Pupils are equal, round, and reactive to light.  Neck:     Vascular: No carotid bruit.  Cardiovascular:     Rate and Rhythm: Normal rate and regular rhythm.  Pulmonary:     Effort: Pulmonary effort is normal.     Breath sounds: Normal breath sounds.  Abdominal:     General: Bowel sounds are normal.  Musculoskeletal:     Cervical back: No rigidity or tenderness.  Lymphadenopathy:     Cervical: No cervical adenopathy.  Skin:    General: Skin is warm and dry.  Neurological:     Mental Status: He is alert and oriented to person, place, and time.  Psychiatric:        Mood and Affect: Mood normal.        Behavior: Behavior normal.    BP 136/74 (BP Location: Right Arm, Patient Position: Sitting, Cuff Size: Large)    Pulse 70    Temp (!) 97.3 F (36.3 C) (Temporal)    Ht 5\' 6"  (1.676 m)    Wt 177 lb (80.3 kg)    SpO2 98%    BMI 28.57 kg/m  Wt Readings from Last 3 Encounters:  01/30/21 177 lb (80.3 kg)  07/27/20 169 lb 3.2 oz (76.7 kg)  12/13/19 168 lb (76.2 kg)     Health Maintenance Due  Topic Date Due   Pneumonia Vaccine 67+ Years old (1 - PCV) Never done   Hepatitis C Screening  Never done   Zoster Vaccines- Shingrix (1 of 2) Never done    There are no preventive care reminders to display for this patient.  Lab Results  Component Value Date   TSH 3.81 07/27/2020   Lab Results  Component Value Date   WBC 7.4 07/27/2020   HGB 15.1 07/27/2020   HCT 44.1 07/27/2020   MCV 92.1 07/27/2020   PLT 216.0 07/27/2020   Lab Results  Component Value Date   NA 139 07/27/2020   K 3.9 07/27/2020   CO2 29  07/27/2020   GLUCOSE 95 07/27/2020   BUN 18 07/27/2020   CREATININE 1.02 07/27/2020   BILITOT 0.7 07/27/2020   ALKPHOS 49 07/27/2020   AST 26 07/27/2020   ALT 34 07/27/2020  PROT 6.6 07/27/2020   ALBUMIN 4.8 07/27/2020   CALCIUM 9.5 07/27/2020   GFR 75.43 07/27/2020   Lab Results  Component Value Date   CHOL 136 07/27/2020   Lab Results  Component Value Date   HDL 48.50 07/27/2020   Lab Results  Component Value Date   LDLCALC 62 07/27/2020   Lab Results  Component Value Date   TRIG 125.0 07/27/2020   Lab Results  Component Value Date   CHOLHDL 3 07/27/2020   Lab Results  Component Value Date   HGBA1C 5.3 07/27/2019      Assessment & Plan:   Problem List Items Addressed This Visit       Respiratory   Acute maxillary sinusitis - Primary   Relevant Medications   amoxicillin (AMOXIL) 875 MG tablet     Genitourinary   Benign prostatic hyperplasia with weak urinary stream   Relevant Orders   PSA     Other   Healthcare maintenance   Relevant Orders   CBC   Comprehensive metabolic panel   Elevated LDL cholesterol level   Relevant Orders   Comprehensive metabolic panel   Lipid panel   Elevated glucose   Relevant Orders   Comprehensive metabolic panel   Urinalysis, Routine w reflex microscopic   Hemoglobin A1c    Meds ordered this encounter  Medications   amoxicillin (AMOXIL) 875 MG tablet    Sig: Take 1 tablet (875 mg total) by mouth 2 (two) times daily for 10 days.    Dispense:  20 tablet    Refill:  0    Follow-up: Return return in 3-4 weeks for physical..    [name], MD

## 2021-02-01 ENCOUNTER — Encounter: Payer: Self-pay | Admitting: Family Medicine

## 2021-02-04 ENCOUNTER — Ambulatory Visit: Payer: Medicare Other | Admitting: Family Medicine

## 2021-02-12 ENCOUNTER — Telehealth: Payer: Self-pay | Admitting: Family Medicine

## 2021-02-12 NOTE — Telephone Encounter (Signed)
Left message for patient to call back and schedule Medicare Annual Wellness Visit (AWV) in office.   If not able to come in office, please offer to do virtually or by telephone.  Left office number and my jabber [phone].  Last AWV:12/13/2019  Please schedule at anytime with Nurse Health Advisor.

## 2021-03-20 ENCOUNTER — Other Ambulatory Visit: Payer: Self-pay | Admitting: Family Medicine

## 2021-03-20 DIAGNOSIS — E7800 Pure hypercholesterolemia, unspecified: Secondary | ICD-10-CM

## 2021-04-15 DIAGNOSIS — M542 Cervicalgia: Secondary | ICD-10-CM | POA: Diagnosis not present

## 2021-04-15 DIAGNOSIS — M25511 Pain in right shoulder: Secondary | ICD-10-CM | POA: Diagnosis not present

## 2021-04-16 ENCOUNTER — Ambulatory Visit (INDEPENDENT_AMBULATORY_CARE_PROVIDER_SITE_OTHER): Payer: Medicare Other

## 2021-04-16 DIAGNOSIS — Z0000 Encounter for general adult medical examination without abnormal findings: Secondary | ICD-10-CM

## 2021-04-16 NOTE — Patient Instructions (Signed)
Robert Meyers , ?Thank you for taking time to come for your Medicare Wellness Visit. I appreciate your ongoing commitment to your health goals. Please review the following plan we discussed and let me know if I can assist you in the future.  ? ?Screening recommendations/referrals: ?Colonoscopy: 03/05/2015 ?Recommended yearly ophthalmology/optometry visit for glaucoma screening and checkup ?Recommended yearly dental visit for hygiene and checkup ? ?Vaccinations: ?Influenza vaccine: completed  ?Pneumococcal vaccine: due  ?Tdap vaccine: 01/30/2021  per patient  ?Shingles vaccine: will consider    ? ?Advanced directives: yes  ? ?Conditions/risks identified: none  ? ?Next appointment: none  ? ?Preventive Care 87 Years and Older, Male ?Preventive care refers to lifestyle choices and visits with your health care provider that can promote health and wellness. ?What does preventive care include? ?A yearly physical exam. This is also called an annual well check. ?Dental exams once or twice a year. ?Routine eye exams. Ask your health care provider how often you should have your eyes checked. ?Personal lifestyle choices, including: ?Daily care of your teeth and gums. ?Regular physical activity. ?Eating a healthy diet. ?Avoiding tobacco and drug use. ?Limiting alcohol use. ?Practicing safe sex. ?Taking low doses of aspirin every day. ?Taking vitamin and mineral supplements as recommended by your health care provider. ?What happens during an annual well check? ?The services and screenings done by your health care provider during your annual well check will depend on your age, overall health, lifestyle risk factors, and family history of disease. ?Counseling  ?Your health care provider may ask you questions about your: ?Alcohol use. ?Tobacco use. ?Drug use. ?Emotional well-being. ?Home and relationship well-being. ?Sexual activity. ?Eating habits. ?History of falls. ?Memory and ability to understand (cognition). ?Work and work  Statistician. ?Screening  ?You may have the following tests or measurements: ?Height, weight, and BMI. ?Blood pressure. ?Lipid and cholesterol levels. These may be checked every 5 years, or more frequently if you are over 33 years old. ?Skin check. ?Lung cancer screening. You may have this screening every year starting at age 17 if you have a 30-pack-year history of smoking and currently smoke or have quit within the past 15 years. ?Fecal occult blood test (FOBT) of the stool. You may have this test every year starting at age 45. ?Flexible sigmoidoscopy or colonoscopy. You may have a sigmoidoscopy every 5 years or a colonoscopy every 10 years starting at age 52. ?Prostate cancer screening. Recommendations will vary depending on your family history and other risks. ?Hepatitis C blood test. ?Hepatitis B blood test. ?Sexually transmitted disease (STD) testing. ?Diabetes screening. This is done by checking your blood sugar (glucose) after you have not eaten for a while (fasting). You may have this done every 1-3 years. ?Abdominal aortic aneurysm (AAA) screening. You may need this if you are a current or former smoker. ?Osteoporosis. You may be screened starting at age 75 if you are at high risk. ?Talk with your health care provider about your test results, treatment options, and if necessary, the need for more tests. ?Vaccines  ?Your health care provider may recommend certain vaccines, such as: ?Influenza vaccine. This is recommended every year. ?Tetanus, diphtheria, and acellular pertussis (Tdap, Td) vaccine. You may need a Td booster every 10 years. ?Zoster vaccine. You may need this after age 53. ?Pneumococcal 13-valent conjugate (PCV13) vaccine. One dose is recommended after age 52. ?Pneumococcal polysaccharide (PPSV23) vaccine. One dose is recommended after age 36. ?Talk to your health care provider about which screenings and vaccines you need  and how often you need them. ?This information is not intended to replace  advice given to you by your health care provider. Make sure you discuss any questions you have with your health care provider. ?Document Released: 02/09/2015 Document Revised: 10/03/2015 Document Reviewed: 11/14/2014 ?Elsevier Interactive Patient Education ? 2017 [place]. ? ?Fall Prevention in the Home ?Falls can cause injuries. They can happen to people of all ages. There are many things you can do to make your home safe and to help prevent falls. ?What can I do on the outside of my home? ?Regularly fix the edges of walkways and driveways and fix any cracks. ?Remove anything that might make you trip as you walk through a door, such as a raised step or threshold. ?Trim any bushes or trees on the path to your home. ?Use bright outdoor lighting. ?Clear any walking paths of anything that might make someone trip, such as rocks or tools. ?Regularly check to see if handrails are loose or broken. Make sure that both sides of any steps have handrails. ?Any raised decks and porches should have guardrails on the edges. ?Have any leaves, snow, or ice cleared regularly. ?Use sand or salt on walking paths during winter. ?Clean up any spills in your garage right away. This includes oil or grease spills. ?What can I do in the bathroom? ?Use night lights. ?Install grab bars by the toilet and in the tub and shower. Do not use towel bars as grab bars. ?Use non-skid mats or decals in the tub or shower. ?If you need to sit down in the shower, use a plastic, non-slip stool. ?Keep the floor dry. Clean up any water that spills on the floor as soon as it happens. ?Remove soap buildup in the tub or shower regularly. ?Attach bath mats securely with double-sided non-slip rug tape. ?Do not have throw rugs and other things on the floor that can make you trip. ?What can I do in the bedroom? ?Use night lights. ?Make sure that you have a light by your bed that is easy to reach. ?Do not use any sheets or blankets that are too big for your bed.  They should not hang down onto the floor. ?Have a firm chair that has side arms. You can use this for support while you get dressed. ?Do not have throw rugs and other things on the floor that can make you trip. ?What can I do in the kitchen? ?Clean up any spills right away. ?Avoid walking on wet floors. ?Keep items that you use a lot in easy-to-reach places. ?If you need to reach something above you, use a strong step stool that has a grab bar. ?Keep electrical cords out of the way. ?Do not use floor polish or wax that makes floors slippery. If you must use wax, use non-skid floor wax. ?Do not have throw rugs and other things on the floor that can make you trip. ?What can I do with my stairs? ?Do not leave any items on the stairs. ?Make sure that there are handrails on both sides of the stairs and use them. Fix handrails that are broken or loose. Make sure that handrails are as long as the stairways. ?Check any carpeting to make sure that it is firmly attached to the stairs. Fix any carpet that is loose or worn. ?Avoid having throw rugs at the top or bottom of the stairs. If you do have throw rugs, attach them to the floor with carpet tape. ?Make sure that you  have a light switch at the top of the stairs and the bottom of the stairs. If you do not have them, ask someone to add them for you. ?What else can I do to help prevent falls? ?Wear shoes that: ?Do not have high heels. ?Have rubber bottoms. ?Are comfortable and fit you well. ?Are closed at the toe. Do not wear sandals. ?If you use a stepladder: ?Make sure that it is fully opened. Do not climb a closed stepladder. ?Make sure that both sides of the stepladder are locked into place. ?Ask someone to hold it for you, if possible. ?Clearly mark and make sure that you can see: ?Any grab bars or handrails. ?First and last steps. ?Where the edge of each step is. ?Use tools that help you move around (mobility aids) if they are needed. These  include: ?Canes. ?Walkers. ?Scooters. ?Crutches. ?Turn on the lights when you go into a dark area. Replace any light bulbs as soon as they burn out. ?Set up your furniture so you have a clear path. Avoid moving your furniture around.

## 2021-04-16 NOTE — Progress Notes (Signed)
? ?Subjective:  ? Robert Meyers is a 71 y.o. male who presents for an Subsequent Medicare Annual Wellness Visit. ? ?Review of Systems    ? ?  ? ?   ?Objective:  ?  ?There were no vitals filed for this visit. ?There is no height or weight on file to calculate BMI. ? ?Advanced Directives 12/13/2019  ?Does Patient Have a Medical Advance Directive? Yes  ?Type of Paramedic of [place];Living will  ?Copy of Robert Meyers in Chart? No - copy requested  ? ? ?Current Medications (verified) ?Outpatient Encounter Medications as of 04/16/2021  ?Medication Sig  ? aspirin EC 81 MG tablet Take 81 mg by mouth daily.  ? atorvastatin (LIPITOR) 20 MG tablet TAKE 1 TABLET BY MOUTH EVERY DAY  ? diclofenac Sodium (VOLTAREN) 1 % GEL Apply a peanut sized dollop to joint up to 4 times daily as needed. (Patient not taking: Reported on 01/30/2021)  ? gabapentin (NEURONTIN) 300 MG capsule Take 1 capsule (300 mg total) by mouth at bedtime. (Patient not taking: Reported on 01/30/2021)  ? omeprazole (PRILOSEC) 20 MG capsule Take 20 mg by mouth daily.  ? ?No facility-administered encounter medications on file as of 04/16/2021.  ? ? ?Allergies (verified) ?Levofloxacin  ? ?History: ?Past Medical History:  ?Diagnosis Date  ? Allergy   ? Cancer [facility])   ? GERD (gastroesophageal reflux disease)   ? ?Past Surgical History:  ?Procedure Laterality Date  ? bilateral inguinal hernia repair    ? deep axillary lymphnode biopsy    ? excision left radial head    ? excision of spermatocele with epididyectomy    ? melanoma wide excision surgery right upper back (stage 2)    ? ?Family History  ?Problem Relation Age of Onset  ? Hyperlipidemia Mother   ? Alzheimer's disease Mother   ? Cancer Father   ? Colon cancer Father   ? Alzheimer's disease Sister   ? ?Social History  ? ?Socioeconomic History  ? Marital status: Married  ?  Spouse name: Not on file  ? Number of children: Not on file  ? Years of education: Not on file  ? Highest  education level: Not on file  ?Occupational History  ? Not on file  ?Tobacco Use  ? Smoking status: Never  ? Smokeless tobacco: Never  ?Vaping Use  ? Vaping Use: Never used  ?Substance and Sexual Activity  ? Alcohol use: Yes  ?  Comment: occasionally  ? Drug use: Never  ? Sexual activity: Not on file  ?Other Topics Concern  ? Not on file  ?Social History Narrative  ? Not on file  ? ?Social Determinants of Health  ? ?Financial Resource Strain: Not on file  ?Food Insecurity: Not on file  ?Transportation Needs: Not on file  ?Physical Activity: Not on file  ?Stress: Not on file  ?Social Connections: Not on file  ? ? ?Tobacco Counseling ?Counseling given: Not Answered ? ? ?Clinical Intake: ? ?  ? ?  ? ?  ? ?  ? ?  ? ?Diabetic?no  ? ?  ? ?  ? ? ?Activities of Daily Living ?No flowsheet data found. ? ?Patient Care Team: ?[name], MD as PCP - General (Family Medicine) ? ?Indicate any recent Medical Services you may have received from other than [facility] providers in the past year (date may be approximate). ? ?   ?Assessment:  ? This is a routine wellness examination for Robert Meyers. ? ?Hearing/Vision screen ?  No results found. ? ?Dietary issues and exercise activities discussed: ?  ? ? Goals Addressed   ?None ?  ? ?Depression Screen ?PHQ 2/9 Scores 12/13/2019 07/27/2019  ?PHQ - 2 Score 0 2  ?PHQ- 9 Score - 4  ?  ?Fall Risk ?Fall Risk  07/27/2020 12/13/2019  ?Falls in the past year? 0 0  ?Number falls in past yr: 0 0  ?Injury with Fall? 0 0  ?Follow up - Falls prevention discussed  ? ? ?FALL RISK PREVENTION PERTAINING TO THE HOME: ? ?Any stairs in or around the home? Yes  ?If so, are there any without handrails? Yes  ?Home free of loose throw rugs in walkways, pet beds, electrical cords, etc? Yes  ?Adequate lighting in your home to reduce risk of falls? Yes  ? ?ASSISTIVE DEVICES UTILIZED TO PREVENT FALLS: ? ?Life alert? No  ?Use of a cane, walker or w/c? No  ?Grab bars in the bathroom? No  ?Shower chair or bench in shower?  Yes  ?Elevated toilet seat or a handicapped toilet? No  ? ? ?Cognitive Function: ? Normal cognitive status assessed by direct observation by this Nurse Health Advisor. No abnormalities found.  ? ?  ?  ? ?Immunizations ?Immunization History  ?Administered Date(s) Administered  ? Fluad Quad(high Dose 65+) 11/10/2018  ? Influenza, High Dose Seasonal PF 11/18/2019  ? Influenza-Unspecified 12/20/2012, 12/02/2020  ? PFIZER(Purple Top)SARS-COV-2 Vaccination 02/15/2019, 03/08/2019, 11/18/2019  ? Pension scheme manager 30yr & up 12/02/2020  ? Td 07/27/2020  ? Tdap 01/28/1995, 12/24/2004  ? Zoster, Live 09/14/2012  ? ? ?TDAP status: Up to date ? ?Flu Vaccine status: Up to date ? ?Pneumococcal vaccine status: Up to date ? ?Covid-19 vaccine status: Completed vaccines ? ?Qualifies for Shingles Vaccine? Yes   ?Zostavax completed No   ?Shingrix Completed?: No.    Education has been provided regarding the importance of this vaccine. Patient has been advised to call insurance company to determine out of pocket expense if they have not yet received this vaccine. Advised may also receive vaccine at local pharmacy or Health Dept. Verbalized acceptance and understanding. ? ?Screening Tests ?Health Maintenance  ?Topic Date Due  ? Hepatitis C Screening  Never done  ? Zoster Vaccines- Shingrix (1 of 2) Never done  ? Pneumonia Vaccine 70 Years old (1 - PCV) Never done  ? COLONOSCOPY (Pts [phone] coverage will need to be confirmed)  03/04/2025  ? TETANUS/TDAP  07/28/2030  ? INFLUENZA VACCINE  Completed  ? COVID-19 Vaccine  Completed  ? HPV VACCINES  Aged Out  ? ? ?Health Maintenance ? ?Health Maintenance Due  ?Topic Date Due  ? Hepatitis C Screening  Never done  ? Zoster Vaccines- Shingrix (1 of 2) Never done  ? Pneumonia Vaccine 6531Years old (1 - PCV) Never done  ? ? ?Colorectal cancer screening: Type of screening: Colonoscopy. Completed 03/05/2015. Repeat every 10 years ? ?Lung Cancer Screening: (Low Dose CT  Chest recommended if Age 70-80ears, 30 pack-year currently smoking OR have quit w/in 15years.) does not qualify.  ? ?Lung Cancer Screening Referral: n/a ? ?Additional Screening: ? ?Hepatitis C Screening: does qualify; ? ?Vision Screening: Recommended annual ophthalmology exams for early detection of glaucoma and other disorders of the eye. ?Is the patient up to date with their annual eye exam?  Yes  ?Who is the provider or what is the name of the office in which the patient attends annual eye exams? Dr.DeAllen  ?If pt is not  established with a provider, would they like to be referred to a provider to establish care? No .  ? ?Dental Screening: Recommended annual dental exams for proper oral hygiene ? ?Community Resource Referral / Chronic Care Management: ?CRR required this visit?  No  ? ?CCM required this visit?  No  ? ? ?  ?Plan:  ?  ? ?I have personally reviewed and noted the following in the patient?s chart:  ? ?Medical and social history ?Use of alcohol, tobacco or illicit drugs  ?Current medications and supplements including opioid prescriptions. Patient is not currently taking opioid prescriptions. ?Functional ability and status ?Nutritional status ?Physical activity ?Advanced directives ?List of other physicians ?Hospitalizations, surgeries, and ER visits in previous 12 months ?Vitals ?Screenings to include cognitive, depression, and falls ?Referrals and appointments ? ?In addition, I have reviewed and discussed with patient certain preventive protocols, quality metrics, and best practice recommendations. A written personalized care plan for preventive services as well as general preventive health recommendations were provided to patient. ?  ? ? ?Randel Pigg, LPN   6/60/[id number]  ? ?Nurse Notes: none  ? ? ? ? ? ?

## 2021-06-06 ENCOUNTER — Telehealth: Payer: Self-pay | Admitting: Gastroenterology

## 2021-06-06 NOTE — Telephone Encounter (Signed)
Patients wife called requesting to have her husband transferred over to Dr. Ardis Hughs from previous GI provider in [facility]. He had a colonoscopy done in 2017 requested records for review. ?

## 2021-08-20 DIAGNOSIS — Z800 Family history of malignant neoplasm of digestive organs: Secondary | ICD-10-CM | POA: Diagnosis not present

## 2021-08-20 DIAGNOSIS — D126 Benign neoplasm of colon, unspecified: Secondary | ICD-10-CM | POA: Diagnosis not present

## 2021-08-20 DIAGNOSIS — D122 Benign neoplasm of ascending colon: Secondary | ICD-10-CM | POA: Diagnosis not present

## 2021-08-20 DIAGNOSIS — K648 Other hemorrhoids: Secondary | ICD-10-CM | POA: Diagnosis not present

## 2021-08-20 DIAGNOSIS — Z1211 Encounter for screening for malignant neoplasm of colon: Secondary | ICD-10-CM | POA: Diagnosis not present

## 2021-08-20 DIAGNOSIS — K635 Polyp of colon: Secondary | ICD-10-CM | POA: Diagnosis not present

## 2021-09-15 ENCOUNTER — Other Ambulatory Visit: Payer: Self-pay | Admitting: Family Medicine

## 2021-09-15 DIAGNOSIS — E7800 Pure hypercholesterolemia, unspecified: Secondary | ICD-10-CM

## 2021-12-09 DIAGNOSIS — D1801 Hemangioma of skin and subcutaneous tissue: Secondary | ICD-10-CM | POA: Diagnosis not present

## 2021-12-09 DIAGNOSIS — L821 Other seborrheic keratosis: Secondary | ICD-10-CM | POA: Diagnosis not present

## 2021-12-09 DIAGNOSIS — L720 Epidermal cyst: Secondary | ICD-10-CM | POA: Diagnosis not present

## 2021-12-09 DIAGNOSIS — L578 Other skin changes due to chronic exposure to nonionizing radiation: Secondary | ICD-10-CM | POA: Diagnosis not present

## 2021-12-09 DIAGNOSIS — I781 Nevus, non-neoplastic: Secondary | ICD-10-CM | POA: Diagnosis not present

## 2021-12-09 DIAGNOSIS — Z85820 Personal history of malignant melanoma of skin: Secondary | ICD-10-CM | POA: Diagnosis not present

## 2021-12-09 DIAGNOSIS — Z85828 Personal history of other malignant neoplasm of skin: Secondary | ICD-10-CM | POA: Diagnosis not present

## 2021-12-09 DIAGNOSIS — D485 Neoplasm of uncertain behavior of skin: Secondary | ICD-10-CM | POA: Diagnosis not present

## 2022-03-11 ENCOUNTER — Other Ambulatory Visit: Payer: Self-pay | Admitting: Family Medicine

## 2022-03-11 DIAGNOSIS — E7800 Pure hypercholesterolemia, unspecified: Secondary | ICD-10-CM

## 2022-04-21 ENCOUNTER — Ambulatory Visit (INDEPENDENT_AMBULATORY_CARE_PROVIDER_SITE_OTHER): Payer: Medicare Other

## 2022-04-21 VITALS — Ht 66.0 in | Wt 165.0 lb

## 2022-04-21 DIAGNOSIS — Z0000 Encounter for general adult medical examination without abnormal findings: Secondary | ICD-10-CM

## 2022-04-21 DIAGNOSIS — H2513 Age-related nuclear cataract, bilateral: Secondary | ICD-10-CM | POA: Diagnosis not present

## 2022-04-21 NOTE — Progress Notes (Signed)
I connected with  Massie Bougie on 04/21/22 by a audio enabled telemedicine application and verified that I am speaking with the correct person using two identifiers.  Patient Location: Home  Provider Location: Office/Clinic  I discussed the limitations of evaluation and management by telemedicine. The patient expressed understanding and agreed to proceed.  Subjective:   Robert Meyers is a 72 y.o. male who presents for Medicare Annual/Subsequent preventive examination.  Review of Systems     Cardiac Risk Factors include: advanced age (>31men, >74 women);male gender     Objective:    Today's Vitals   04/21/22 1012  Weight: 165 lb (74.8 kg)  Height: 5\' 6"  (1.676 m)   Body mass index is 26.63 kg/m.     04/21/2022   10:18 AM 04/16/2021   11:23 AM 12/13/2019    9:51 AM  Advanced Directives  Does Patient Have a Medical Advance Directive? Yes Yes Yes  Type of Paramedic of [place];Living will [place];Living will [place];Living will  Copy of [place] in Chart? No - copy requested No - copy requested No - copy requested    Current Medications (verified) Outpatient Encounter Medications as of 04/21/2022  Medication Sig   aspirin EC 81 MG tablet Take 81 mg by mouth daily.   atorvastatin (LIPITOR) 20 MG tablet TAKE 1 TABLET BY MOUTH EVERY DAY (Patient taking differently: Taking half a pill daily)   omeprazole (PRILOSEC) 20 MG capsule Take 20 mg by mouth daily. Takes half a pill daily   diclofenac Sodium (VOLTAREN) 1 % GEL Apply a peanut sized dollop to joint up to 4 times daily as needed. (Patient not taking: Reported on 01/30/2021)   gabapentin (NEURONTIN) 300 MG capsule Take 1 capsule (300 mg total) by mouth at bedtime. (Patient not taking: Reported on 01/30/2021)   No facility-administered encounter medications on file as of 04/21/2022.    Allergies (verified) Levofloxacin   History: Past  Medical History:  Diagnosis Date   Allergy    Cancer ([place])    GERD (gastroesophageal reflux disease)    Past Surgical History:  Procedure Laterality Date   bilateral inguinal hernia repair     deep axillary lymphnode biopsy     excision left radial head     excision of spermatocele with epididyectomy     melanoma wide excision surgery right upper back (stage 2)     Family History  Problem Relation Age of Onset   Hyperlipidemia Mother    Alzheimer's disease Mother    Cancer Father    Colon cancer Father    Alzheimer's disease Sister    Social History   Socioeconomic History   Marital status: Married    Spouse name: Not on file   Number of children: Not on file   Years of education: Not on file   Highest education level: Not on file  Occupational History   Not on file  Tobacco Use   Smoking status: Never   Smokeless tobacco: Never  Vaping Use   Vaping Use: Never used  Substance and Sexual Activity   Alcohol use: Yes    Comment: occasionally   Drug use: Never   Sexual activity: Not on file  Other Topics Concern   Not on file  Social History Narrative   Not on file   Social Determinants of Health   Financial Resource Strain: Low Risk  (04/21/2022)   Overall Financial Resource Strain (CARDIA)    Difficulty  of Paying Living Expenses: Not hard at all  Food Insecurity: No Food Insecurity (04/21/2022)   Hunger Vital Sign    Worried About Running Out of Food in the Last Year: Never true    Ran Out of Food in the Last Year: Never true  Transportation Needs: No Transportation Needs (04/21/2022)   PRAPARE - Hydrologist (Medical): No    Lack of Transportation (Non-Medical): No  Physical Activity: Insufficiently Active (04/21/2022)   Exercise Vital Sign    Days of Exercise per Week: 7 days    Minutes of Exercise per Session: 20 min  Stress: No Stress Concern Present (04/21/2022)   [place]    Feeling of Stress : Not at all  Social Connections: Moderately Isolated (04/16/2021)   Social Connection and Isolation Panel [NHANES]    Frequency of Communication with Friends and Family: Twice a week    Frequency of Social Gatherings with Friends and Family: Twice a week    Attends Religious Services: Never    Marine scientist or Organizations: No    Attends Music therapist: Never    Marital Status: Married    Tobacco Counseling Counseling given: Not Answered   Clinical Intake:  Pre-visit preparation completed: Yes  Pain : No/denies pain     Nutritional Status: BMI 25 -29 Overweight Nutritional Risks: None Diabetes: No  How often do you need to have someone help you when you read instructions, pamphlets, or other written materials from your doctor or pharmacy?: 1 - Never  Diabetic? no  Interpreter Needed?: No  Information entered by :: NAllen LPN   Activities of Daily Living    04/21/2022   10:19 AM  In your present state of health, do you have any difficulty performing the following activities:  Hearing? 0  Vision? 0  Difficulty concentrating or making decisions? 0  Walking or climbing stairs? 0  Dressing or bathing? 0  Doing errands, shopping? 0  Preparing Food and eating ? N  Using the Toilet? N  In the past six months, have you accidently leaked urine? N  Do you have problems with loss of bowel control? N  Managing your Medications? N  Managing your Finances? N  Housekeeping or managing your Housekeeping? N    Patient Care Team: [name], MD as PCP - General (Family Medicine)  Indicate any recent Medical Services you may have received from other than [facility] providers in the past year (date may be approximate).     Assessment:   This is a routine wellness examination for Robert Meyers.  Hearing/Vision screen Vision Screening - Comments:: Regular eye exams , Dr. Isac Caddy  Dietary issues and exercise  activities discussed: Current Exercise Habits: Home exercise routine, Type of exercise: walking, Frequency (Times/Week): 7   Goals Addressed             This Visit's Progress    Patient Stated       04/21/2022, no goals       Depression Screen    04/21/2022   10:19 AM 04/16/2021   11:22 AM 12/13/2019    9:53 AM 07/27/2019   10:46 AM  PHQ 2/9 Scores  PHQ - 2 Score 0 0 0 2  PHQ- 9 Score    4    Fall Risk    04/21/2022   10:19 AM 04/16/2021   11:24 AM 07/27/2020   10:39 AM 12/13/2019  9:52 AM  Fall Risk   Falls in the past year? 0 0 0 0  Number falls in past yr: 0 0 0 0  Injury with Fall? 0 0 0 0  Risk for fall due to : Medication side effect No Fall Risks    Follow up Falls prevention discussed;Education provided;Falls evaluation completed Falls evaluation completed  Falls prevention discussed    FALL RISK PREVENTION PERTAINING TO THE HOME:  Any stairs in or around the home? Yes  If so, are there any without handrails? No  Home free of loose throw rugs in walkways, pet beds, electrical cords, etc? Yes  Adequate lighting in your home to reduce risk of falls? Yes   ASSISTIVE DEVICES UTILIZED TO PREVENT FALLS:  Life alert? No  Use of a cane, walker or w/c? No  Grab bars in the bathroom? No  Shower chair or bench in shower? Yes  Elevated toilet seat or a handicapped toilet? Yes   TIMED UP AND GO:  Was the test performed? No .      Cognitive Function:        04/21/2022   10:20 AM  6CIT Screen  What Year? 0 points  What month? 0 points  What time? 0 points  Count back from 20 0 points  Months in reverse 0 points  Repeat phrase 0 points  Total Score 0 points    Immunizations Immunization History  Administered Date(s) Administered   Fluad Quad(high Dose 65+) 11/10/2018   Influenza, High Dose Seasonal PF 11/18/2019   Influenza-Unspecified 12/20/2012, 12/02/2020   PFIZER(Purple Top)SARS-COV-2 Vaccination 02/15/2019, 03/08/2019, 11/18/2019   Pfizer  Covid-19 Vaccine Bivalent Booster 49yrs & up 12/02/2020   Td 07/27/2020   Tdap 01/28/1995, 12/24/2004   Zoster Recombinat (Shingrix) 02/13/2022   Zoster, Live 09/14/2012    TDAP status: Up to date  Flu Vaccine status: Due, Education has been provided regarding the importance of this vaccine. Advised may receive this vaccine at local pharmacy or Health Dept. Aware to provide a copy of the vaccination record if obtained from local pharmacy or Health Dept. Verbalized acceptance and understanding.  Pneumococcal vaccine status: Due, Education has been provided regarding the importance of this vaccine. Advised may receive this vaccine at local pharmacy or Health Dept. Aware to provide a copy of the vaccination record if obtained from local pharmacy or Health Dept. Verbalized acceptance and understanding.  Covid-19 vaccine status: Completed vaccines  Qualifies for Shingles Vaccine? Yes   Zostavax completed Yes   Shingrix Completed?: needs second dose  Screening Tests Health Maintenance  Topic Date Due   Hepatitis C Screening  Never done   Pneumonia Vaccine 19+ Years old (1 of 1 - PCV) Never done   INFLUENZA VACCINE  08/27/2021   COVID-19 Vaccine (5 - 2023-24 season) 09/27/2021   Zoster Vaccines- Shingrix (2 of 2) 04/10/2022   Medicare Annual Wellness (AWV)  04/17/2022   COLONOSCOPY (Pts 45-87yrs Insurance coverage will need to be confirmed)  03/04/2025   DTaP/Tdap/Td (4 - Td or Tdap) 07/28/2030   HPV VACCINES  Aged Out    Health Maintenance  Health Maintenance Due  Topic Date Due   Hepatitis C Screening  Never done   Pneumonia Vaccine 24+ Years old (1 of 1 - PCV) Never done   INFLUENZA VACCINE  08/27/2021   COVID-19 Vaccine (5 - 2023-24 season) 09/27/2021   Zoster Vaccines- Shingrix (2 of 2) 04/10/2022   Medicare Annual Wellness (AWV)  04/17/2022    Colorectal cancer screening:  Type of screening: Colonoscopy. Completed 2023. Repeat every 10 years  Lung Cancer Screening: (Low  Dose CT Chest recommended if Age 108-80 years, 30 pack-year currently smoking OR have quit w/in 15years.) does not qualify.   Lung Cancer Screening Referral: no  Additional Screening:  Hepatitis C Screening: does qualify;  Vision Screening: Recommended annual ophthalmology exams for early detection of glaucoma and other disorders of the eye. Is the patient up to date with their annual eye exam?  Yes  Who is the provider or what is the name of the office in which the patient attends annual eye exams? Dr. Isac Caddy If pt is not established with a provider, would they like to be referred to a provider to establish care? No .   Dental Screening: Recommended annual dental exams for proper oral hygiene  Community Resource Referral / Chronic Care Management: CRR required this visit?  No   CCM required this visit?  No      Plan:     I have personally reviewed and noted the following in the patient's chart:   Medical and social history Use of alcohol, tobacco or illicit drugs  Current medications and supplements including opioid prescriptions. Patient is not currently taking opioid prescriptions. Functional ability and status Nutritional status Physical activity Advanced directives List of other physicians Hospitalizations, surgeries, and ER visits in previous 12 months Vitals Screenings to include cognitive, depression, and falls Referrals and appointments  In addition, I have reviewed and discussed with patient certain preventive protocols, quality metrics, and best practice recommendations. A written personalized care plan for preventive services as well as general preventive health recommendations were provided to patient.     Kellie Simmering, LPN   [id number]   Nurse Notes: none  Due to this being a virtual visit, the after visit summary with patients personalized plan was offered to patient via mail or my-chart.  to pick up at office at next visit

## 2022-04-21 NOTE — Patient Instructions (Signed)
Robert Meyers , Thank you for taking time to come for your Medicare Wellness Visit. I appreciate your ongoing commitment to your health goals. Please review the following plan we discussed and let me know if I can assist you in the future.   These are the goals we discussed:  Goals      Patient Stated     None     Patient Stated     04/21/2022, no goals        This is a list of the screening recommended for you and due dates:  Health Maintenance  Topic Date Due   Hepatitis C Screening: USPSTF Recommendation to screen - Ages 51-79 yo.  Never done   Pneumonia Vaccine (1 of 1 - PCV) Never done   Flu Shot  08/27/2021   COVID-19 Vaccine (5 - 2023-24 season) 09/27/2021   Zoster (Shingles) Vaccine (2 of 2) 04/10/2022   Medicare Annual Wellness Visit  04/21/2023   Colon Cancer Screening  03/04/2025   DTaP/Tdap/Td vaccine (4 - Td or Tdap) 07/28/2030   HPV Vaccine  Aged Out    Advanced directives: Please bring a copy of your POA (Power of [place]) and/or Living Will to your next appointment.   Conditions/risks identified: none  Next appointment: Follow up in one year for your annual wellness visit.   Preventive Care 17 Years and Older, Male  Preventive care refers to lifestyle choices and visits with your health care provider that can promote health and wellness. What does preventive care include? A yearly physical exam. This is also called an annual well check. Dental exams once or twice a year. Routine eye exams. Ask your health care provider how often you should have your eyes checked. Personal lifestyle choices, including: Daily care of your teeth and gums. Regular physical activity. Eating a healthy diet. Avoiding tobacco and drug use. Limiting alcohol use. Practicing safe sex. Taking low doses of aspirin every day. Taking vitamin and mineral supplements as recommended by your health care provider. What happens during an annual well check? The services and screenings done  by your health care provider during your annual well check will depend on your age, overall health, lifestyle risk factors, and family history of disease. Counseling  Your health care provider may ask you questions about your: Alcohol use. Tobacco use. Drug use. Emotional well-being. Home and relationship well-being. Sexual activity. Eating habits. History of falls. Memory and ability to understand (cognition). Work and work Statistician. Screening  You may have the following tests or measurements: Height, weight, and BMI. Blood pressure. Lipid and cholesterol levels. These may be checked every 5 years, or more frequently if you are over 50 years old. Skin check. Lung cancer screening. You may have this screening every year starting at age 16 if you have a 30-pack-year history of smoking and currently smoke or have quit within the past 15 years. Fecal occult blood test (FOBT) of the stool. You may have this test every year starting at age 64. Flexible sigmoidoscopy or colonoscopy. You may have a sigmoidoscopy every 5 years or a colonoscopy every 10 years starting at age 19. Prostate cancer screening. Recommendations will vary depending on your family history and other risks. Hepatitis C blood test. Hepatitis B blood test. Sexually transmitted disease (STD) testing. Diabetes screening. This is done by checking your blood sugar (glucose) after you have not eaten for a while (fasting). You may have this done every 1-3 years. Abdominal aortic aneurysm (AAA) screening. You may need  this if you are a current or former smoker. Osteoporosis. You may be screened starting at age 23 if you are at high risk. Talk with your health care provider about your test results, treatment options, and if necessary, the need for more tests. Vaccines  Your health care provider may recommend certain vaccines, such as: Influenza vaccine. This is recommended every year. Tetanus, diphtheria, and acellular  pertussis (Tdap, Td) vaccine. You may need a Td booster every 10 years. Zoster vaccine. You may need this after age 76. Pneumococcal 13-valent conjugate (PCV13) vaccine. One dose is recommended after age 66. Pneumococcal polysaccharide (PPSV23) vaccine. One dose is recommended after age 58. Talk to your health care provider about which screenings and vaccines you need and how often you need them. This information is not intended to replace advice given to you by your health care provider. Make sure you discuss any questions you have with your health care provider. Document Released: 02/09/2015 Document Revised: 10/03/2015 Document Reviewed: 11/14/2014 Elsevier Interactive Patient Education  2017 [employer] Prevention in the Home Falls can cause injuries. They can happen to people of all ages. There are many things you can do to make your home safe and to help prevent falls. What can I do on the outside of my home? Regularly fix the edges of walkways and driveways and fix any cracks. Remove anything that might make you trip as you walk through a door, such as a raised step or threshold. Trim any bushes or trees on the path to your home. Use bright outdoor lighting. Clear any walking paths of anything that might make someone trip, such as rocks or tools. Regularly check to see if handrails are loose or broken. Make sure that both sides of any steps have handrails. Any raised decks and porches should have guardrails on the edges. Have any leaves, snow, or ice cleared regularly. Use sand or salt on walking paths during winter. Clean up any spills in your garage right away. This includes oil or grease spills. What can I do in the bathroom? Use night lights. Install grab bars by the toilet and in the tub and shower. Do not use towel bars as grab bars. Use non-skid mats or decals in the tub or shower. If you need to sit down in the shower, use a plastic, non-slip stool. Keep the floor  dry. Clean up any water that spills on the floor as soon as it happens. Remove soap buildup in the tub or shower regularly. Attach bath mats securely with double-sided non-slip rug tape. Do not have throw rugs and other things on the floor that can make you trip. What can I do in the bedroom? Use night lights. Make sure that you have a light by your bed that is easy to reach. Do not use any sheets or blankets that are too big for your bed. They should not hang down onto the floor. Have a firm chair that has side arms. You can use this for support while you get dressed. Do not have throw rugs and other things on the floor that can make you trip. What can I do in the kitchen? Clean up any spills right away. Avoid walking on wet floors. Keep items that you use a lot in easy-to-reach places. If you need to reach something above you, use a strong step stool that has a grab bar. Keep electrical cords out of the way. Do not use floor polish or wax that makes floors  slippery. If you must use wax, use non-skid floor wax. Do not have throw rugs and other things on the floor that can make you trip. What can I do with my stairs? Do not leave any items on the stairs. Make sure that there are handrails on both sides of the stairs and use them. Fix handrails that are broken or loose. Make sure that handrails are as long as the stairways. Check any carpeting to make sure that it is firmly attached to the stairs. Fix any carpet that is loose or worn. Avoid having throw rugs at the top or bottom of the stairs. If you do have throw rugs, attach them to the floor with carpet tape. Make sure that you have a light switch at the top of the stairs and the bottom of the stairs. If you do not have them, ask someone to add them for you. What else can I do to help prevent falls? Wear shoes that: Do not have high heels. Have rubber bottoms. Are comfortable and fit you well. Are closed at the toe. Do not wear  sandals. If you use a stepladder: Make sure that it is fully opened. Do not climb a closed stepladder. Make sure that both sides of the stepladder are locked into place. Ask someone to hold it for you, if possible. Clearly mark and make sure that you can see: Any grab bars or handrails. First and last steps. Where the edge of each step is. Use tools that help you move around (mobility aids) if they are needed. These include: Canes. Walkers. Scooters. Crutches. Turn on the lights when you go into a dark area. Replace any light bulbs as soon as they burn out. Set up your furniture so you have a clear path. Avoid moving your furniture around. If any of your floors are uneven, fix them. If there are any pets around you, be aware of where they are. Review your medicines with your doctor. Some medicines can make you feel dizzy. This can increase your chance of falling. Ask your doctor what other things that you can do to help prevent falls. This information is not intended to replace advice given to you by your health care provider. Make sure you discuss any questions you have with your health care provider. Document Released: 11/09/2008 Document Revised: 06/21/2015 Document Reviewed: 02/17/2014 Elsevier Interactive Patient Education  2017 [employer].

## 2022-05-19 ENCOUNTER — Encounter: Payer: Self-pay | Admitting: Family Medicine

## 2022-05-19 ENCOUNTER — Ambulatory Visit (INDEPENDENT_AMBULATORY_CARE_PROVIDER_SITE_OTHER): Payer: Medicare Other | Admitting: Family Medicine

## 2022-05-19 VITALS — BP 118/76 | HR 66 | Temp 98.2°F | Ht 66.0 in | Wt 171.0 lb

## 2022-05-19 DIAGNOSIS — Z0000 Encounter for general adult medical examination without abnormal findings: Secondary | ICD-10-CM

## 2022-05-19 DIAGNOSIS — E7800 Pure hypercholesterolemia, unspecified: Secondary | ICD-10-CM | POA: Diagnosis not present

## 2022-05-19 DIAGNOSIS — R0683 Snoring: Secondary | ICD-10-CM

## 2022-05-19 DIAGNOSIS — R3912 Poor urinary stream: Secondary | ICD-10-CM | POA: Diagnosis not present

## 2022-05-19 DIAGNOSIS — N401 Enlarged prostate with lower urinary tract symptoms: Secondary | ICD-10-CM

## 2022-05-19 DIAGNOSIS — R7309 Other abnormal glucose: Secondary | ICD-10-CM

## 2022-05-19 LAB — HEMOGLOBIN A1C: Hgb A1c MFr Bld: 5.3 % (ref 4.6–6.5)

## 2022-05-19 LAB — COMPREHENSIVE METABOLIC PANEL
ALT: 33 U/L (ref 0–53)
AST: 27 U/L (ref 0–37)
Albumin: 4.9 g/dL (ref 3.5–5.2)
Alkaline Phosphatase: 47 U/L (ref 39–117)
BUN: 17 mg/dL (ref 6–23)
CO2: 30 mEq/L (ref 19–32)
Calcium: 9.5 mg/dL (ref 8.4–10.5)
Chloride: 101 mEq/L (ref 96–112)
Creatinine, Ser: 1.04 mg/dL (ref 0.40–1.50)
GFR: 72.76 mL/min (ref >60.00)
Glucose, Bld: 90 mg/dL (ref 70–99)
Potassium: 4.6 mEq/L (ref 3.5–5.1)
Sodium: 139 mEq/L (ref 135–145)
Total Bilirubin: 0.7 mg/dL (ref 0.2–1.2)
Total Protein: 6.9 g/dL (ref 6.0–8.3)

## 2022-05-19 LAB — CBC
HCT: 46.7 % (ref 39.0–52.0)
Hemoglobin: 16.1 g/dL (ref 13.0–17.0)
MCHC: 34.5 g/dL (ref 30.0–36.0)
MCV: 92.9 fl (ref 78.0–100.0)
Platelets: 233 10*3/uL (ref 150.0–400.0)
RBC: 5.03 Mil/uL (ref 4.22–5.81)
RDW: 12.8 % (ref 11.5–15.5)
WBC: 7.1 10*3/uL (ref 4.0–10.5)

## 2022-05-19 LAB — LIPID PANEL
Cholesterol: 155 mg/dL (ref 0–200)
HDL: 45.1 mg/dL (ref >39.00)
LDL Cholesterol: 85 mg/dL (ref 0–99)
NonHDL: 109.82
Total CHOL/HDL Ratio: 3
Triglycerides: 122 mg/dL (ref 0.0–149.0)
VLDL: 24.4 mg/dL (ref 0.0–40.0)

## 2022-05-19 LAB — URINALYSIS, ROUTINE W REFLEX MICROSCOPIC
Bilirubin Urine: NEGATIVE
Hgb urine dipstick: NEGATIVE
Ketones, ur: NEGATIVE
Leukocytes,Ua: NEGATIVE
Nitrite: NEGATIVE
RBC / HPF: NONE SEEN (ref 0–?)
Specific Gravity, Urine: 1.015 (ref 1.000–1.030)
Total Protein, Urine: NEGATIVE
Urine Glucose: NEGATIVE
Urobilinogen, UA: 0.2 (ref 0.0–1.0)
pH: 7.5 (ref 5.0–8.0)

## 2022-05-19 LAB — PSA: PSA: 2.56 ng/mL (ref 0.10–4.00)

## 2022-05-19 NOTE — Progress Notes (Signed)
Established Patient Office Visit   Subjective:  Patient ID: Robert Meyers, male    DOB: [birth year]  Age: 72 y.o. MRN: [id number]  Chief Complaint  Patient presents with   Annual Exam    CPE, no concerns. Patient fasting.     HPI Encounter Diagnoses  Name Primary?   Healthcare maintenance Yes   Benign prostatic hyperplasia with weak urinary stream    Elevated LDL cholesterol level    Elevated glucose    Snores    For yearly physical and follow-up of above.  Has experienced arthralgias in his knees feet and shoulders.  Currently also seeing orthopedic surgery.  He has halved the dose of atorvastatin to 10 mg daily and this seems to have helped.  He is trying to wean himself off of the omeprazole.  He uses mustard for an acid relief.  Continues to stay active by exercising regularly.  Has regular dental care.  Wife's been concerned about his snoring and witnessed apneic episodes.   Review of Systems  Constitutional: Negative.   HENT: Negative.    Eyes:  Negative for blurred vision, discharge and redness.  Respiratory: Negative.    Cardiovascular: Negative.   Gastrointestinal:  Negative for abdominal pain.  Genitourinary: Negative.   Musculoskeletal: Negative.  Negative for myalgias.  Skin:  Negative for rash.  Neurological:  Negative for tingling, loss of consciousness and weakness.  Endo/Heme/Allergies:  Negative for polydipsia.     Current Outpatient Medications:    aspirin EC 81 MG tablet, Take 81 mg by mouth daily., Disp: , Rfl:    atorvastatin (LIPITOR) 20 MG tablet, TAKE 1 TABLET BY MOUTH EVERY DAY (Patient taking differently: Taking half a pill daily), Disp: 90 tablet, Rfl: 1   omeprazole (PRILOSEC) 20 MG capsule, Take 20 mg by mouth daily. Takes half a pill daily, Disp: , Rfl:    Objective:     BP 118/76 (BP Location: Left Arm, Patient Position: Sitting, Cuff Size: Normal)   Pulse 66   Temp 98.2 F (36.8 C) (Temporal)   Ht  (1.676 m)   Wt 171 lb (77.6  kg)   SpO2 99%   BMI 27.60 kg/m    Physical Exam Constitutional:      General: He is not in acute distress.    Appearance: Normal appearance. He is not ill-appearing, toxic-appearing or diaphoretic.  HENT:     Head: Normocephalic and atraumatic.     Right Ear: Tympanic membrane, ear canal and external ear normal.     Left Ear: Tympanic membrane, ear canal and external ear normal.     Mouth/Throat:     Mouth: Mucous membranes are moist.     Pharynx: Oropharynx is clear. No oropharyngeal exudate or posterior oropharyngeal erythema.  Eyes:     General: No scleral icterus.       Right eye: No discharge.        Left eye: No discharge.     Extraocular Movements: Extraocular movements intact.     Conjunctiva/sclera: Conjunctivae normal.     Pupils: Pupils are equal, round, and reactive to light.  Cardiovascular:     Rate and Rhythm: Normal rate and regular rhythm.  Pulmonary:     Effort: Pulmonary effort is normal. No respiratory distress.     Breath sounds: Normal breath sounds.  Abdominal:     General: Bowel sounds are normal.     Tenderness: There is no abdominal tenderness. There is no guarding.     Hernia: No  hernia is present.  Musculoskeletal:     Cervical back: No rigidity or tenderness.  Lymphadenopathy:     Cervical: No cervical adenopathy.  Skin:    General: Skin is warm and dry.  Neurological:     Mental Status: He is alert and oriented to person, place, and time.  Psychiatric:        Mood and Affect: Mood normal.        Behavior: Behavior normal.      No results found for any visits on 05/19/22.    The 10-year ASCVD risk score (Arnett DK, et al., 2019) is: 15%    Assessment & Plan:   Healthcare maintenance -     CBC -     Comprehensive metabolic panel -     Urinalysis, Routine w reflex microscopic  Benign prostatic hyperplasia with weak urinary stream -     PSA  Elevated LDL cholesterol level -     Comprehensive metabolic panel -     Lipid  panel  Elevated glucose -     Hemoglobin A1c  Snores -     Ambulatory referral to Pulmonology    Return in about 1 year (around 05/19/2023), or if symptoms worsen or fail to improve.  Agrees with pulmonology referral for apnea evaluation.  Continue 10 mg of atorvastatin.  Continue omeprazole wean as tolerated.  Does not feel as though the H symptoms need treatment.  Information was given on BPH for review.  Information was also given on health maintenance and disease prevention.  Mliss Sax, MD

## 2022-06-04 ENCOUNTER — Institutional Professional Consult (permissible substitution): Payer: Medicare Other | Admitting: Primary Care

## 2022-10-07 DIAGNOSIS — H25013 Cortical age-related cataract, bilateral: Secondary | ICD-10-CM | POA: Diagnosis not present

## 2022-10-07 DIAGNOSIS — H2513 Age-related nuclear cataract, bilateral: Secondary | ICD-10-CM | POA: Diagnosis not present

## 2022-10-07 DIAGNOSIS — H2512 Age-related nuclear cataract, left eye: Secondary | ICD-10-CM | POA: Diagnosis not present

## 2022-10-07 DIAGNOSIS — H25043 Posterior subcapsular polar age-related cataract, bilateral: Secondary | ICD-10-CM | POA: Diagnosis not present

## 2022-10-21 DIAGNOSIS — L089 Local infection of the skin and subcutaneous tissue, unspecified: Secondary | ICD-10-CM | POA: Diagnosis not present

## 2022-10-21 DIAGNOSIS — W5501XA Bitten by cat, initial encounter: Secondary | ICD-10-CM | POA: Diagnosis not present

## 2022-10-21 DIAGNOSIS — B9689 Other specified bacterial agents as the cause of diseases classified elsewhere: Secondary | ICD-10-CM | POA: Diagnosis not present

## 2022-10-21 DIAGNOSIS — Z23 Encounter for immunization: Secondary | ICD-10-CM | POA: Diagnosis not present

## 2022-10-21 DIAGNOSIS — S60511A Abrasion of right hand, initial encounter: Secondary | ICD-10-CM | POA: Diagnosis not present

## 2022-10-21 DIAGNOSIS — T148XXA Other injury of unspecified body region, initial encounter: Secondary | ICD-10-CM | POA: Diagnosis not present

## 2022-12-08 DIAGNOSIS — L578 Other skin changes due to chronic exposure to nonionizing radiation: Secondary | ICD-10-CM | POA: Diagnosis not present

## 2022-12-08 DIAGNOSIS — D1801 Hemangioma of skin and subcutaneous tissue: Secondary | ICD-10-CM | POA: Diagnosis not present

## 2022-12-08 DIAGNOSIS — Z85820 Personal history of malignant melanoma of skin: Secondary | ICD-10-CM | POA: Diagnosis not present

## 2022-12-08 DIAGNOSIS — D225 Melanocytic nevi of trunk: Secondary | ICD-10-CM | POA: Diagnosis not present

## 2022-12-08 DIAGNOSIS — Z85828 Personal history of other malignant neoplasm of skin: Secondary | ICD-10-CM | POA: Diagnosis not present

## 2022-12-08 DIAGNOSIS — L821 Other seborrheic keratosis: Secondary | ICD-10-CM | POA: Diagnosis not present

## 2022-12-08 DIAGNOSIS — L814 Other melanin hyperpigmentation: Secondary | ICD-10-CM | POA: Diagnosis not present

## 2022-12-08 DIAGNOSIS — I781 Nevus, non-neoplastic: Secondary | ICD-10-CM | POA: Diagnosis not present

## 2022-12-12 DIAGNOSIS — H2511 Age-related nuclear cataract, right eye: Secondary | ICD-10-CM | POA: Diagnosis not present

## 2022-12-12 DIAGNOSIS — H2512 Age-related nuclear cataract, left eye: Secondary | ICD-10-CM | POA: Diagnosis not present

## 2022-12-18 DIAGNOSIS — Z961 Presence of intraocular lens: Secondary | ICD-10-CM | POA: Diagnosis not present

## 2022-12-29 HISTORY — PX: CATARACT EXTRACTION: SUR2

## 2023-01-02 DIAGNOSIS — H2511 Age-related nuclear cataract, right eye: Secondary | ICD-10-CM | POA: Diagnosis not present

## 2023-01-08 DIAGNOSIS — Z961 Presence of intraocular lens: Secondary | ICD-10-CM | POA: Diagnosis not present

## 2023-04-27 ENCOUNTER — Ambulatory Visit (INDEPENDENT_AMBULATORY_CARE_PROVIDER_SITE_OTHER): Payer: Medicare Other

## 2023-04-27 VITALS — BP 121/68 | Ht 66.0 in | Wt 171.0 lb

## 2023-04-27 DIAGNOSIS — Z0000 Encounter for general adult medical examination without abnormal findings: Secondary | ICD-10-CM | POA: Diagnosis not present

## 2023-04-27 NOTE — Patient Instructions (Addendum)
 [name] , Thank you for taking time to come for your Medicare Wellness Visit. I appreciate your ongoing commitment to your health goals. Please review the following plan we discussed and let me know if I can assist you in the future.   Referrals/Orders/Follow-Ups/Clinician Recommendations:  Next Medicare Annual Wellness Visit:   April 29, 2024 at 8:50 am telephone visit.   Aim for 30 minutes of exercise or brisk walking, 6-8 glasses of water, and 5 servings of fruits and vegetables each day.   This is a list of the screening recommended for you and due dates:  Health Maintenance  Topic Date Due   Hepatitis C Screening  Never done   Flu Shot  08/28/2022   COVID-19 Vaccine (5 - 2024-25 season) 09/28/2022   Pneumonia Vaccine (1 of 1 - PCV) 05/19/2023*   Medicare Annual Wellness Visit  04/26/2024   DTaP/Tdap/Td vaccine (6 - Td or Tdap) 07/28/2030   Colon Cancer Screening  08/21/2031   Zoster (Shingles) Vaccine  Completed   HPV Vaccine  Aged Out  *Topic was postponed. The date shown is not the original due date.    Advanced directives: (ACP Link)Information on Advanced Care Planning can be found at [facility] [place] Advance Health Care Directives Advance Health Care Directives. [email or web address]   Next Medicare Annual Wellness Visit scheduled for next year: yes  Understanding Your Risk for Falls Millions of people have serious injuries from falls each year. It is important to understand your risk of falling. Talk with your health care provider about your risk and what you can do to lower it. If you do have a serious fall, make sure to tell your provider. Falling once raises your risk of falling again. How can falls affect me? Serious injuries from falls are common. These include: Broken bones, such as hip fractures. Head injuries, such as traumatic brain injuries (TBI) or concussions. A fear of falling can cause you to avoid activities and stay at home. This can make your  muscles weaker and raise your risk for a fall. What can increase my risk? There are a number of risk factors that increase your risk for falling. The more risk factors you have, the higher your risk of falling. Serious injuries from a fall happen most often to people who are older than 72 years old. Teenagers and young adults ages 42-29 are also at higher risk. Common risk factors include: Weakness in the lower body. Being generally weak or confused due to long-term (chronic) illness. Dizziness or balance problems. Poor vision. Medicines that cause dizziness or drowsiness. These may include: Medicines for your blood pressure, heart, anxiety, insomnia, or swelling (edema). Pain medicines. Muscle relaxants. Other risk factors include: Drinking alcohol. Having had a fall in the past. Having foot pain or wearing improper footwear. Working at a dangerous job. Having any of the following in your home: Tripping hazards, such as floor clutter or loose rugs. Poor lighting. Pets. Having dementia or memory loss. What actions can I take to lower my risk of falling?     Physical activity Stay physically fit. Do strength and balance exercises. Consider taking a regular class to build strength and balance. Yoga and tai chi are good options. Vision Have your eyes checked every year and your prescription for glasses or contacts updated as needed. Shoes and walking aids Wear non-skid shoes. Wear shoes that have rubber soles and low heels. Do not wear high heels. Do not walk around the house in socks  or slippers. Use a cane or walker as told by your provider. Home safety Attach secure railings on both sides of your stairs. Install grab bars for your bathtub, shower, and toilet. Use a non-skid mat in your bathtub or shower. Attach bath mats securely with double-sided, non-slip rug tape. Use good lighting in all rooms. Keep a flashlight near your bed. Make sure there is a clear path from your bed  to the bathroom. Use night-lights. Do not use throw rugs. Make sure all carpeting is taped or tacked down securely. Remove all clutter from walkways and stairways, including extension cords. Repair uneven or broken steps and floors. Avoid walking on icy or slippery surfaces. Walk on the grass instead of on icy or slick sidewalks. Use ice melter to get rid of ice on walkways in the winter. Use a cordless phone. Questions to ask your health care provider Can you help me check my risk for a fall? Do any of my medicines make me more likely to fall? Should I take a vitamin D supplement? What exercises can I do to improve my strength and balance? Should I make an appointment to have my vision checked? Do I need a bone density test to check for weak bones (osteoporosis)? Would it help to use a cane or a walker? Where to find more information Centers for Disease Control and Prevention, STEADI: TonerPromos.no Community-Based Fall Prevention Programs: TonerPromos.no [employer] on Aging: BaseRingTones.pl Contact a health care provider if: You fall at home. You are afraid of falling at home. You feel weak, drowsy, or dizzy. This information is not intended to replace advice given to you by your health care provider. Make sure you discuss any questions you have with your health care provider. Document Revised: 09/16/2021 Document Reviewed: 09/16/2021 Elsevier Patient Education  2024 [employer].

## 2023-04-27 NOTE — Addendum Note (Signed)
 Addended by: Recardo Evangelist A on: 04/27/2023 04:39 PM   Modules accepted: Orders, Level of Service

## 2023-04-27 NOTE — Progress Notes (Addendum)
 Please attest and cosign this visit due to patients primary care provider not being in the office at the time the visit was completed.  Because this visit was a virtual/telehealth visit,  certain criteria was not obtained, such a blood pressure, CBG if applicable, and timed get up and go. Any medications not marked as "taking" were not mentioned during the medication reconciliation part of the visit. Any vitals not documented were not able to be obtained due to this being a telehealth visit or patient was unable to self-report a recent blood pressure reading due to a lack of equipment at home via telehealth. Vitals that have been documented are verbally provided by the patient.   Subjective:   Robert Meyers is a 73 y.o. who presents for a Medicare Wellness preventive visit.  Visit Complete: Virtual I connected with  Robert Meyers on 04/27/23 by a audio enabled telemedicine application and verified that I am speaking with the correct person using two identifiers.  Patient Location: Home  Provider Location: Office/Clinic  I discussed the limitations of evaluation and management by telemedicine. The patient expressed understanding and agreed to proceed.  Vital Signs: Because this visit was a virtual/telehealth visit, some criteria may be missing or patient reported. Any vitals not documented were not able to be obtained and vitals that have been documented are patient reported.  VideoDeclined- This patient declined Librarian, academic. Therefore the visit was completed with audio only.  Persons Participating in Visit: Patient.  AWV Questionnaire: No: Patient Medicare AWV questionnaire was not completed prior to this visit.  Cardiac Risk Factors include: advanced age (>70men, >59 women);male gender     Objective:    Today's Vitals   04/27/23 1031  BP: 121/68  Weight: 171 lb (77.6 kg)  Height: 5\' 6"  (1.676 m)   Body mass index is 27.6 kg/m.     04/27/2023    10:35 AM 04/21/2022   10:18 AM 04/16/2021   11:23 AM 12/13/2019    9:51 AM  Advanced Directives  Does Patient Have a Medical Advance Directive? No Yes Yes Yes  Type of Special educational needs teacher of [place];Living will Healthcare Power of [place];Living will Healthcare Power of [place];Living will  Copy of Healthcare Power of Attorney in Chart?  No - copy requested No - copy requested No - copy requested  Would patient like information on creating a medical advance directive? No - Patient declined       Current Medications (verified) Outpatient Encounter Medications as of 04/27/2023  Medication Sig   aspirin EC 81 MG tablet Take 81 mg by mouth daily.   atorvastatin (LIPITOR) 20 MG tablet TAKE 1 TABLET BY MOUTH EVERY DAY (Patient taking differently: Taking half a pill daily)   omeprazole (PRILOSEC) 20 MG capsule Take 20 mg by mouth daily. Takes half a pill daily   No facility-administered encounter medications on file as of 04/27/2023.    Allergies (verified) Levofloxacin   History: Past Medical History:  Diagnosis Date   Allergy    Cancer (HCC)    GERD (gastroesophageal reflux disease)    Past Surgical History:  Procedure Laterality Date   bilateral inguinal hernia repair     deep axillary lymphnode biopsy     excision left radial head     excision of spermatocele with epididyectomy     melanoma wide excision surgery right upper back (stage 2)     Family History  Problem Relation Age of Onset   Hyperlipidemia Mother  Alzheimer's disease Mother    Cancer Father    Colon cancer Father    Alzheimer's disease Sister    Social History   Socioeconomic History   Marital status: Married    Spouse name: Not on file   Number of children: Not on file   Years of education: Not on file   Highest education level: Not on file  Occupational History   Not on file  Tobacco Use   Smoking status: Never   Smokeless tobacco: Never  Vaping Use   Vaping status: Never  Used  Substance and Sexual Activity   Alcohol use: Yes    Comment: occasionally   Drug use: Never   Sexual activity: Not on file  Other Topics Concern   Not on file  Social History Narrative   Not on file   Social Drivers of Health   Financial Resource Strain: Low Risk  (04/27/2023)   Overall Financial Resource Strain (CARDIA)    Difficulty of Paying Living Expenses: Not hard at all  Food Insecurity: No Food Insecurity (04/27/2023)   Hunger Vital Sign    Worried About Running Out of Food in the Last Year: Never true    Ran Out of Food in the Last Year: Never true  Transportation Needs: No Transportation Needs (04/27/2023)   PRAPARE - Administrator, Civil Service (Medical): No    Lack of Transportation (Non-Medical): No  Physical Activity: Sufficiently Active (04/27/2023)   Exercise Vital Sign    Days of Exercise per Week: 7 days    Minutes of Exercise per Session: 30 min  Stress: No Stress Concern Present (04/27/2023)   [employer] of Occupational Health - Occupational Stress Questionnaire    Feeling of Stress : Not at all  Social Connections: Moderately Integrated (04/27/2023)   Social Connection and Isolation Panel [NHANES]    Frequency of Communication with Friends and Family: More than three times a week    Frequency of Social Gatherings with Friends and Family: More than three times a week    Attends Religious Services: More than 4 times per year    Active Member of [employer] or Organizations: No    Attends Engineer, structural: Never    Marital Status: Married    Tobacco Counseling Counseling given: Yes    Clinical Intake:  Pre-visit preparation completed: Yes  Pain : No/denies pain     BMI - recorded: 27.6 Nutritional Status: BMI 25 -29 Overweight Nutritional Risks: None Diabetes: No  Lab Results  Component Value Date   HGBA1C 5.3 05/19/2022   HGBA1C 5.6 01/30/2021   HGBA1C 5.3 07/27/2019     How often do you need to have  someone help you when you read instructions, pamphlets, or other written materials from your doctor or pharmacy?: 1 - Never  Interpreter Needed?: No  Information entered by :: Maryjean Ka CMA   Activities of Daily Living     04/27/2023   10:34 AM  In your present state of health, do you have any difficulty performing the following activities:  Hearing? 0  Vision? 0  Difficulty concentrating or making decisions? 0  Walking or climbing stairs? 0  Dressing or bathing? 0  Doing errands, shopping? 0  Preparing Food and eating ? N  Using the Toilet? N  In the past six months, have you accidently leaked urine? N  Do you have problems with loss of bowel control? N  Managing your Medications? N  Managing your Finances? N  Housekeeping or managing your Housekeeping? N    Patient Care Team: Mliss Sax, MD as PCP - General (Family Medicine)  Indicate any recent Medical Services you may have received from other than [facility] providers in the past year (date may be approximate).     Assessment:   This is a routine wellness examination for Robert Meyers.  Hearing/Vision screen Hearing Screening - Comments:: Patient denies any hearing difficulties.   Vision Screening - Comments:: Patient wears reading glasses only. Up to date with yearly exams.     Goals Addressed             This Visit's Progress    Patient Stated       Patient stated to get up and get going every day       Depression Screen     04/27/2023   10:36 AM 05/19/2022   11:28 AM 05/19/2022   10:47 AM 04/21/2022   10:19 AM 04/16/2021   11:22 AM 12/13/2019    9:53 AM 07/27/2019   10:46 AM  PHQ 2/9 Scores  PHQ - 2 Score 0 0 0 0 0 0 2  PHQ- 9 Score 0 4     4    Fall Risk     04/27/2023   10:36 AM 05/19/2022   10:47 AM 04/21/2022   10:19 AM 04/16/2021   11:24 AM 07/27/2020   10:39 AM  Fall Risk   Falls in the past year? 0 0 0 0 0  Number falls in past yr: 0 0 0 0 0  Injury with Fall? 0 0 0 0 0  Risk for fall due  to : No Fall Risks No Fall Risks Medication side effect No Fall Risks   Follow up Falls prevention discussed;Falls evaluation completed Falls evaluation completed Falls prevention discussed;Education provided;Falls evaluation completed Falls evaluation completed     MEDICARE RISK AT HOME:  Medicare Risk at Home Any stairs in or around the home?: Yes If so, are there any without handrails?: No Home free of loose throw rugs in walkways, pet beds, electrical cords, etc?: Yes Adequate lighting in your home to reduce risk of falls?: Yes Life alert?: No Use of a cane, walker or w/c?: No Grab bars in the bathroom?: Yes Shower chair or bench in shower?: Yes Elevated toilet seat or a handicapped toilet?: Yes  TIMED UP AND GO:  Was the test performed?  No  Cognitive Function: 6CIT completed        04/27/2023   10:36 AM 04/21/2022   10:20 AM  6CIT Screen  What Year? 0 points 0 points  What month? 0 points 0 points  What time? 0 points 0 points  Count back from 20 0 points 0 points  Months in reverse 0 points 0 points  Repeat phrase 0 points 0 points  Total Score 0 points 0 points    Immunizations Immunization History  Administered Date(s) Administered   Fluad Quad(high Dose 65+) 11/10/2018   Influenza, High Dose Seasonal PF 11/18/2019   Influenza-Unspecified 12/20/2012, 12/02/2020   PFIZER(Purple Top)SARS-COV-2 Vaccination 02/15/2019, 03/08/2019, 11/18/2019   Pfizer Covid-19 Vaccine Bivalent Booster 26yrs & up 12/02/2020   Td 07/27/2020   Td (Adult),5 Lf Tetanus Toxid, Preservative Free 01/28/1995, 12/24/2004   Tdap 01/28/1995, 12/24/2004   Zoster Recombinant(Shingrix) 02/13/2022, 05/19/2022   Zoster, Live 09/14/2012    Screening Tests Health Maintenance  Topic Date Due   Hepatitis C Screening  Never done   INFLUENZA VACCINE  08/28/2022   COVID-19 Vaccine (  5 - 2024-25 season) 09/28/2022   Pneumonia Vaccine 64+ Years old (1 of 1 - PCV) 05/19/2023 (Originally 11/22/2016)    Medicare Annual Wellness (AWV)  04/26/2024   DTaP/Tdap/Td (6 - Td or Tdap) 07/28/2030   Colonoscopy  08/21/2031   Zoster Vaccines- Shingrix  Completed   HPV VACCINES  Aged Out    Health Maintenance  Health Maintenance Due  Topic Date Due   Hepatitis C Screening  Never done   INFLUENZA VACCINE  08/28/2022   COVID-19 Vaccine (5 - 2024-25 season) 09/28/2022   Health Maintenance Items Addressed: Declined vaccines that are due.   Additional Screening:  Vision Screening: Recommended annual ophthalmology exams for early detection of glaucoma and other disorders of the eye.  Dental Screening: Recommended annual dental exams for proper oral hygiene  Community Resource Referral / Chronic Care Management: CRR required this visit?  No   CCM required this visit?  No     Plan:     I have personally reviewed and noted the following in the patient's chart:   Medical and social history Use of alcohol, tobacco or illicit drugs  Current medications and supplements including opioid prescriptions. Patient is not currently taking opioid prescriptions. Functional ability and status Nutritional status Physical activity Advanced directives List of other physicians Hospitalizations, surgeries, and ER visits in previous 12 months Vitals Screenings to include cognitive, depression, and falls Referrals and appointments  In addition, I have reviewed and discussed with patient certain preventive protocols, quality metrics, and best practice recommendations. A written personalized care plan for preventive services as well as general preventive health recommendations were provided to patient.     Jordan Hawks Karmelo Bass, CMA   04/27/2023   After Visit Summary: (MyChart) Due to this being a telephonic visit, the after visit summary with patients personalized plan was offered to patient via MyChart   Notes: Nothing significant to report at this time.

## 2023-07-08 DIAGNOSIS — L820 Inflamed seborrheic keratosis: Secondary | ICD-10-CM | POA: Diagnosis not present

## 2023-07-08 DIAGNOSIS — L718 Other rosacea: Secondary | ICD-10-CM | POA: Diagnosis not present

## 2023-07-08 DIAGNOSIS — D485 Neoplasm of uncertain behavior of skin: Secondary | ICD-10-CM | POA: Diagnosis not present

## 2023-10-20 ENCOUNTER — Telehealth: Payer: Self-pay

## 2023-10-20 NOTE — Telephone Encounter (Signed)
 Copied from CRM [id number]. Topic: General - Other >> Oct 20, 2023  8:59 AM Rea ORN wrote: Reason for CRM: Pt called to request lab orders be placed before his 10/29/23 CPE appt. The pt lives an hour away and will be local this week for a dental appt He would like to have labs drawn when he is in town this week so that he doesn't have to fast all day on 10/29/23. Please call back to advise when labs are placed.

## 2023-10-29 ENCOUNTER — Encounter: Payer: Self-pay | Admitting: Family Medicine

## 2023-10-29 ENCOUNTER — Ambulatory Visit (INDEPENDENT_AMBULATORY_CARE_PROVIDER_SITE_OTHER): Admitting: Family Medicine

## 2023-10-29 VITALS — BP 124/80 | HR 63 | Temp 97.6°F | Ht 66.0 in | Wt 172.4 lb

## 2023-10-29 DIAGNOSIS — Z2820 Immunization not carried out because of patient decision for unspecified reason: Secondary | ICD-10-CM

## 2023-10-29 DIAGNOSIS — E538 Deficiency of other specified B group vitamins: Secondary | ICD-10-CM

## 2023-10-29 DIAGNOSIS — R3912 Poor urinary stream: Secondary | ICD-10-CM | POA: Diagnosis not present

## 2023-10-29 DIAGNOSIS — Z0000 Encounter for general adult medical examination without abnormal findings: Secondary | ICD-10-CM

## 2023-10-29 DIAGNOSIS — N401 Enlarged prostate with lower urinary tract symptoms: Secondary | ICD-10-CM

## 2023-10-29 DIAGNOSIS — R7309 Other abnormal glucose: Secondary | ICD-10-CM

## 2023-10-29 DIAGNOSIS — E7800 Pure hypercholesterolemia, unspecified: Secondary | ICD-10-CM

## 2023-10-29 LAB — COMPREHENSIVE METABOLIC PANEL WITH GFR
ALT: 32 U/L (ref 0–53)
AST: 26 U/L (ref 0–37)
Albumin: 5 g/dL (ref 3.5–5.2)
Alkaline Phosphatase: 43 U/L (ref 39–117)
BUN: 16 mg/dL (ref 6–23)
CO2: 29 meq/L (ref 19–32)
Calcium: 9.4 mg/dL (ref 8.4–10.5)
Chloride: 101 meq/L (ref 96–112)
Creatinine, Ser: 0.98 mg/dL (ref 0.40–1.50)
GFR: 77.35 mL/min (ref >60.00)
Glucose, Bld: 84 mg/dL (ref 70–99)
Potassium: 3.9 meq/L (ref 3.5–5.1)
Sodium: 139 meq/L (ref 135–145)
Total Bilirubin: 0.9 mg/dL (ref 0.2–1.2)
Total Protein: 6.8 g/dL (ref 6.0–8.3)

## 2023-10-29 LAB — URINALYSIS, ROUTINE W REFLEX MICROSCOPIC
Bilirubin Urine: NEGATIVE
Ketones, ur: NEGATIVE
Leukocytes,Ua: NEGATIVE
Nitrite: NEGATIVE
Specific Gravity, Urine: 1.01 (ref 1.000–1.030)
Total Protein, Urine: NEGATIVE
Urine Glucose: NEGATIVE
Urobilinogen, UA: 0.2 (ref 0.0–1.0)
pH: 6.5 (ref 5.0–8.0)

## 2023-10-29 LAB — CBC WITH DIFFERENTIAL/PLATELET
Basophils Absolute: 0.1 K/uL (ref 0.0–0.1)
Basophils Relative: 0.8 % (ref 0.0–3.0)
Eosinophils Absolute: 0.3 K/uL (ref 0.0–0.7)
Eosinophils Relative: 5 % (ref 0.0–5.0)
HCT: 45.1 % (ref 39.0–52.0)
Hemoglobin: 15.5 g/dL (ref 13.0–17.0)
Lymphocytes Relative: 26.9 % (ref 12.0–46.0)
Lymphs Abs: 1.8 K/uL (ref 0.7–4.0)
MCHC: 34.4 g/dL (ref 30.0–36.0)
MCV: 91.1 fl (ref 78.0–100.0)
Monocytes Absolute: 0.4 K/uL (ref 0.1–1.0)
Monocytes Relative: 6.3 % (ref 3.0–12.0)
Neutro Abs: 4 K/uL (ref 1.4–7.7)
Neutrophils Relative %: 61 % (ref 43.0–77.0)
Platelets: 199 K/uL (ref 150.0–400.0)
RBC: 4.95 Mil/uL (ref 4.22–5.81)
RDW: 13.1 % (ref 11.5–15.5)
WBC: 6.6 K/uL (ref 4.0–10.5)

## 2023-10-29 LAB — LIPID PANEL
Cholesterol: 147 mg/dL (ref 0–200)
HDL: 42.7 mg/dL (ref >39.00)
LDL Cholesterol: 83 mg/dL (ref 0–99)
NonHDL: 103.83
Total CHOL/HDL Ratio: 3
Triglycerides: 105 mg/dL (ref 0.0–149.0)
VLDL: 21 mg/dL (ref 0.0–40.0)

## 2023-10-29 LAB — VITAMIN B12: Vitamin B-12: 465 pg/mL (ref 211–911)

## 2023-10-29 LAB — HEMOGLOBIN A1C: Hgb A1c MFr Bld: 5.5 % (ref 4.6–6.5)

## 2023-10-29 LAB — PSA: PSA: 2.06 ng/mL (ref 0.10–4.00)

## 2023-10-29 MED ORDER — TAMSULOSIN HCL 0.4 MG PO CAPS: 0.4000 mg | ORAL_CAPSULE | Freq: Every day | ORAL | 3 refills | Status: DC

## 2023-10-29 MED ORDER — ATORVASTATIN CALCIUM 10 MG PO TABS: 10.0000 mg | ORAL_TABLET | Freq: Every day | ORAL | 3 refills | Status: AC

## 2023-10-29 NOTE — Progress Notes (Signed)
 Established Patient Office Visit   Subjective:  Patient ID: Robert Meyers, male    DOB: 04-23-1950  Age: 73 y.o. MRN: [id number]  Chief Complaint  Patient presents with   Annual Exam    CPE-Pt is fasting.    HPI Encounter Diagnoses  Name Primary?   Healthcare maintenance Yes   Elevated LDL cholesterol level    Benign prostatic hyperplasia with weak urinary stream    Elevated glucose    Vaccine refused by patient    B12 deficiency    In for physical and follow-up of above.  Continues with 10 mg of atorvastatin  daily without issue.  Urine flow continues to be an issue.  Decreased force of stream especially with the first a.m. void.  Occasionally experiences nocturia.  Nonpainful paresthesias in his feet when he sits in his recliner.   Review of Systems  Constitutional: Negative.   HENT: Negative.    Eyes:  Negative for blurred vision, discharge and redness.  Respiratory: Negative.    Cardiovascular: Negative.   Gastrointestinal:  Negative for abdominal pain.  Genitourinary: Negative.   Musculoskeletal: Negative.  Negative for myalgias.  Skin:  Negative for rash.  Neurological:  Negative for tingling, loss of consciousness and weakness.  Endo/Heme/Allergies:  Negative for polydipsia.     Current Outpatient Medications:    aspirin EC 81 MG tablet, Take 81 mg by mouth daily., Disp: , Rfl:    atorvastatin  (LIPITOR) 10 MG tablet, Take 1 tablet (10 mg total) by mouth daily., Disp: 90 tablet, Rfl: 3   omeprazole (PRILOSEC) 20 MG capsule, Take 20 mg by mouth daily. Takes half a pill daily, Disp: , Rfl:    tamsulosin  (FLOMAX ) 0.4 MG CAPS capsule, Take 1 capsule (0.4 mg total) by mouth daily., Disp: 30 capsule, Rfl: 3   Objective:     BP 124/80 (BP Location: Right Arm, Patient Position: Sitting, Cuff Size: Normal)   Pulse 63   Temp 97.6 F (36.4 C) (Temporal)   Ht 5' 6 (1.676 m)   Wt 172 lb 6.4 oz (78.2 kg)   SpO2 98%   BMI 27.83 kg/m    Physical  Exam Constitutional:      General: He is not in acute distress.    Appearance: Normal appearance. He is not ill-appearing, toxic-appearing or diaphoretic.  HENT:     Head: Normocephalic and atraumatic.     Right Ear: Tympanic membrane, ear canal and external ear normal.     Left Ear: Tympanic membrane, ear canal and external ear normal.     Mouth/Throat:     Mouth: Mucous membranes are moist.     Pharynx: Oropharynx is clear. No oropharyngeal exudate or posterior oropharyngeal erythema.  Eyes:     General: No scleral icterus.       Right eye: No discharge.        Left eye: No discharge.     Extraocular Movements: Extraocular movements intact.     Conjunctiva/sclera: Conjunctivae normal.     Pupils: Pupils are equal, round, and reactive to light.  Cardiovascular:     Rate and Rhythm: Normal rate and regular rhythm.  Pulmonary:     Effort: Pulmonary effort is normal. No respiratory distress.     Breath sounds: Normal breath sounds. No wheezing or rales.  Abdominal:     General: Bowel sounds are normal.     Tenderness: There is no abdominal tenderness. There is no guarding.     Hernia: No hernia is present.  Musculoskeletal:  Cervical back: No rigidity or tenderness.  Skin:    General: Skin is warm and dry.  Neurological:     Mental Status: He is alert and oriented to person, place, and time.  Psychiatric:        Mood and Affect: Mood normal.        Behavior: Behavior normal.      No results found for any visits on 10/29/23.    The 10-year ASCVD risk score (Arnett DK, et al., 2019) is: 17.1%    Assessment & Plan:   Healthcare maintenance -     CBC with Differential/Platelet -     Urinalysis, Routine w reflex microscopic  Elevated LDL cholesterol level -     Comprehensive metabolic panel with GFR -     Lipid panel -     Atorvastatin  Calcium ; Take 1 tablet (10 mg total) by mouth daily.  Dispense: 90 tablet; Refill: 3  Benign prostatic hyperplasia with weak  urinary stream -     PSA -     Tamsulosin  HCl; Take 1 capsule (0.4 mg total) by mouth daily.  Dispense: 30 capsule; Refill: 3  Elevated glucose -     Comprehensive metabolic panel with GFR -     Hemoglobin A1c  Vaccine refused by patient  B12 deficiency -     Vitamin B12    Return in about 1 year (around 10/28/2024), or if symptoms worsen or fail to improve.  Will try tamsulosin  for BPH symptoms.  Urology referral if requested.  Information was given on health maintenance and disease prevention.  Recommended 30 minutes of exercise daily.   Elsie Sim Lent, MD

## 2023-10-30 ENCOUNTER — Ambulatory Visit: Payer: Self-pay | Admitting: Family Medicine

## 2024-02-20 ENCOUNTER — Other Ambulatory Visit: Payer: Self-pay | Admitting: Family Medicine

## 2024-02-20 DIAGNOSIS — N401 Enlarged prostate with lower urinary tract symptoms: Secondary | ICD-10-CM

## 2024-04-29 ENCOUNTER — Ambulatory Visit

## 2024-05-03 ENCOUNTER — Ambulatory Visit

## 2024-11-01 ENCOUNTER — Encounter: Admitting: Family Medicine
